# Patient Record
Sex: Male | Born: 1985 | Race: White | Hispanic: No | Marital: Single | Smoking: Current every day smoker
Health system: Southern US, Community
[De-identification: ages and names within clinical notes are randomized; demographics above are authoritative.]

---
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
## 2005-04-06 ENCOUNTER — Emergency Department (HOSPITAL_COMMUNITY): Admission: EM | Admit: 2005-04-06 | Discharge: 2005-04-06 | Payer: Self-pay | Admitting: Emergency Medicine

## 2005-09-17 ENCOUNTER — Emergency Department (HOSPITAL_COMMUNITY): Admission: EM | Admit: 2005-09-17 | Discharge: 2005-09-17 | Payer: Self-pay | Admitting: Emergency Medicine

## 2005-10-20 ENCOUNTER — Emergency Department (HOSPITAL_COMMUNITY): Admission: EM | Admit: 2005-10-20 | Discharge: 2005-10-20 | Payer: Self-pay | Admitting: Emergency Medicine

## 2005-10-28 ENCOUNTER — Emergency Department (HOSPITAL_COMMUNITY): Admission: EM | Admit: 2005-10-28 | Discharge: 2005-10-28 | Payer: Self-pay | Admitting: Emergency Medicine

## 2005-11-08 ENCOUNTER — Emergency Department (HOSPITAL_COMMUNITY): Admission: EM | Admit: 2005-11-08 | Discharge: 2005-11-08 | Payer: Self-pay | Admitting: Emergency Medicine

## 2005-12-08 ENCOUNTER — Emergency Department (HOSPITAL_COMMUNITY): Admission: EM | Admit: 2005-12-08 | Discharge: 2005-12-08 | Payer: Self-pay | Admitting: Emergency Medicine

## 2007-08-29 ENCOUNTER — Emergency Department (HOSPITAL_COMMUNITY): Admission: EM | Admit: 2007-08-29 | Discharge: 2007-08-29 | Payer: Self-pay | Admitting: Emergency Medicine

## 2007-09-05 ENCOUNTER — Emergency Department (HOSPITAL_COMMUNITY): Admission: EM | Admit: 2007-09-05 | Discharge: 2007-09-05 | Payer: Self-pay | Admitting: Emergency Medicine

## 2008-02-06 ENCOUNTER — Emergency Department (HOSPITAL_COMMUNITY): Admission: EM | Admit: 2008-02-06 | Discharge: 2008-02-06 | Payer: Self-pay | Admitting: Emergency Medicine

## 2008-02-10 ENCOUNTER — Emergency Department (HOSPITAL_COMMUNITY): Admission: EM | Admit: 2008-02-10 | Discharge: 2008-02-10 | Payer: Self-pay | Admitting: Emergency Medicine

## 2008-06-10 ENCOUNTER — Emergency Department (HOSPITAL_COMMUNITY): Admission: EM | Admit: 2008-06-10 | Discharge: 2008-06-10 | Payer: Self-pay | Admitting: Emergency Medicine

## 2009-03-19 ENCOUNTER — Emergency Department (HOSPITAL_COMMUNITY): Admission: EM | Admit: 2009-03-19 | Discharge: 2009-03-19 | Payer: Self-pay | Admitting: Emergency Medicine

## 2009-03-31 ENCOUNTER — Emergency Department (HOSPITAL_COMMUNITY): Admission: EM | Admit: 2009-03-31 | Discharge: 2009-03-31 | Payer: Self-pay | Admitting: Emergency Medicine

## 2009-05-24 ENCOUNTER — Emergency Department (HOSPITAL_COMMUNITY): Admission: EM | Admit: 2009-05-24 | Discharge: 2009-05-24 | Payer: Self-pay | Admitting: Emergency Medicine

## 2009-06-06 ENCOUNTER — Emergency Department (HOSPITAL_COMMUNITY): Admission: EM | Admit: 2009-06-06 | Discharge: 2009-06-06 | Payer: Self-pay | Admitting: Emergency Medicine

## 2010-04-06 LAB — URINALYSIS, ROUTINE W REFLEX MICROSCOPIC
Glucose, UA: NEGATIVE mg/dL
Hgb urine dipstick: NEGATIVE
Protein, ur: NEGATIVE mg/dL
pH: 6.5 (ref 5.0–8.0)

## 2010-04-06 LAB — RAPID URINE DRUG SCREEN, HOSP PERFORMED
Amphetamines: NOT DETECTED
Barbiturates: NOT DETECTED
Benzodiazepines: NOT DETECTED
Cocaine: POSITIVE — AB
Opiates: NOT DETECTED

## 2010-04-06 LAB — DIFFERENTIAL
Basophils Absolute: 0 10*3/uL (ref 0.0–0.1)
Eosinophils Relative: 3 % (ref 0–5)
Lymphocytes Relative: 24 % (ref 12–46)
Monocytes Absolute: 1.1 10*3/uL — ABNORMAL HIGH (ref 0.1–1.0)

## 2010-04-06 LAB — COMPREHENSIVE METABOLIC PANEL
AST: 24 U/L (ref 0–37)
Albumin: 4 g/dL (ref 3.5–5.2)
Alkaline Phosphatase: 83 U/L (ref 39–117)
Chloride: 107 mEq/L (ref 96–112)
GFR calc Af Amer: >60 mL/min (ref >60)
Potassium: 3.9 mEq/L (ref 3.5–5.1)
Total Bilirubin: 0.5 mg/dL (ref 0.3–1.2)

## 2010-04-06 LAB — POCT CARDIAC MARKERS: Myoglobin, poc: 63.3 ng/mL (ref 12–200)

## 2010-04-06 LAB — CBC
Platelets: 179 10*3/uL (ref 150–400)
WBC: 9.7 10*3/uL (ref 4.0–10.5)

## 2010-06-07 ENCOUNTER — Emergency Department (HOSPITAL_COMMUNITY)
Admission: EM | Admit: 2010-06-07 | Discharge: 2010-06-07 | Disposition: A | Discharge status: Home / Self Care | Payer: Self-pay | Attending: Emergency Medicine | Admitting: Emergency Medicine

## 2010-06-07 DIAGNOSIS — R0789 Other chest pain: Secondary | ICD-10-CM | POA: Insufficient documentation

## 2010-11-13 ENCOUNTER — Emergency Department (HOSPITAL_COMMUNITY)
Admission: EM | Admit: 2010-11-13 | Discharge: 2010-11-13 | Disposition: A | Discharge status: Home / Self Care | Payer: No Typology Code available for payment source | Attending: Emergency Medicine | Admitting: Emergency Medicine

## 2010-11-13 ENCOUNTER — Emergency Department (HOSPITAL_COMMUNITY): Payer: No Typology Code available for payment source

## 2010-11-13 DIAGNOSIS — S7000XA Contusion of unspecified hip, initial encounter: Secondary | ICD-10-CM | POA: Insufficient documentation

## 2010-11-13 DIAGNOSIS — IMO0002 Reserved for concepts with insufficient information to code with codable children: Secondary | ICD-10-CM | POA: Insufficient documentation

## 2010-11-13 DIAGNOSIS — R209 Unspecified disturbances of skin sensation: Secondary | ICD-10-CM | POA: Insufficient documentation

## 2010-11-13 DIAGNOSIS — F17200 Nicotine dependence, unspecified, uncomplicated: Secondary | ICD-10-CM | POA: Insufficient documentation

## 2010-11-13 DIAGNOSIS — M5416 Radiculopathy, lumbar region: Secondary | ICD-10-CM

## 2010-11-13 DIAGNOSIS — M25559 Pain in unspecified hip: Secondary | ICD-10-CM | POA: Insufficient documentation

## 2010-11-13 MED ORDER — HYDROCODONE-ACETAMINOPHEN 5-325 MG PO TABS: ORAL_TABLET | ORAL | Status: DC
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
## 2010-11-13 NOTE — ED Provider Notes (Signed)
Medical screening examination/treatment/procedure(s) were performed by non-physician practitioner and as supervising physician I was immediately available for consultation/collaboration.   Nimco Bivens M Lucella Pommier, DO 11/13/10 1743 
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
## 2011-12-19 ENCOUNTER — Encounter (HOSPITAL_COMMUNITY): Payer: Self-pay | Admitting: *Deleted

## 2011-12-19 ENCOUNTER — Emergency Department (HOSPITAL_COMMUNITY): Payer: Medicaid Other

## 2011-12-19 ENCOUNTER — Emergency Department (HOSPITAL_COMMUNITY)
Admission: EM | Admit: 2011-12-19 | Discharge: 2011-12-19 | Disposition: A | Discharge status: Home / Self Care | Payer: Medicaid Other | Attending: Emergency Medicine | Admitting: Emergency Medicine

## 2011-12-19 DIAGNOSIS — IMO0002 Reserved for concepts with insufficient information to code with codable children: Secondary | ICD-10-CM

## 2011-12-19 DIAGNOSIS — F17200 Nicotine dependence, unspecified, uncomplicated: Secondary | ICD-10-CM | POA: Insufficient documentation

## 2011-12-19 DIAGNOSIS — S21209A Unspecified open wound of unspecified back wall of thorax without penetration into thoracic cavity, initial encounter: Secondary | ICD-10-CM | POA: Insufficient documentation

## 2011-12-19 DIAGNOSIS — Z23 Encounter for immunization: Secondary | ICD-10-CM | POA: Insufficient documentation

## 2011-12-19 MED ORDER — TETANUS-DIPHTH-ACELL PERTUSSIS 5-2.5-18.5 LF-MCG/0.5 IM SUSP
0.5000 mL | Freq: Once | INTRAMUSCULAR | Status: AC
  Administered 2011-12-19: 0.5 mL via INTRAMUSCULAR
  Filled 2011-12-19: qty 0.5

## 2011-12-19 MED ORDER — LIDOCAINE-EPINEPHRINE 2 %-1:100000 IJ SOLN
30.0000 mL | Freq: Once | INTRAMUSCULAR | Status: AC
  Administered 2011-12-19: 30 mL
  Filled 2011-12-19: qty 30

## 2011-12-19 NOTE — ED Provider Notes (Signed)
Medical screening examination/treatment/procedure(s) were conducted as a shared visit with non-physician practitioner(s) and myself.  I personally evaluated the patient during the encounter   Rolan Bucco, MD 12/19/11 1520

## 2011-12-19 NOTE — ED Provider Notes (Signed)
LACERATION REPAIR Performed by: Wynetta Emery Authorized by: Wynetta Emery Consent: Verbal consent obtained. Risks and benefits: risks, benefits and alternatives were discussed Consent given by: patient Patient identity confirmed: provided demographic data Prepped and Draped in normal sterile fashion  Tetanus Updated  Laceration Location: Lumbar back  Laceration Length: 15 cm and 1cm  Anesthesia: Local   Local anesthetic: 2% lidocaine with epi   Anesthetic total: 10 ml  Irrigation method: syringe  Amount of cleaning: copious   Wound explored to depth in good light on a bloodless field with no foreign bodies seen or palpated.   Skin closure: Small staples   Patient tolerance: Patient tolerated the procedure well with no immediate complications.  Antibx ointment applied. Instructions for care discussed verbally and patient provided with additional written instructions for homecare and f/u.  Wynetta Emery, PA-C 12/19/11 1453
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
## 2011-12-22 ENCOUNTER — Emergency Department (HOSPITAL_COMMUNITY)
Admission: EM | Admit: 2011-12-22 | Discharge: 2011-12-22 | Disposition: A | Discharge status: Home / Self Care | Payer: Medicaid Other | Attending: Emergency Medicine | Admitting: Emergency Medicine

## 2011-12-22 ENCOUNTER — Encounter (HOSPITAL_COMMUNITY): Payer: Self-pay

## 2011-12-22 DIAGNOSIS — Y289XXA Contact with unspecified sharp object, undetermined intent, initial encounter: Secondary | ICD-10-CM | POA: Insufficient documentation

## 2011-12-22 DIAGNOSIS — Z4801 Encounter for change or removal of surgical wound dressing: Secondary | ICD-10-CM | POA: Insufficient documentation

## 2011-12-22 DIAGNOSIS — Y929 Unspecified place or not applicable: Secondary | ICD-10-CM | POA: Insufficient documentation

## 2011-12-22 DIAGNOSIS — S21209A Unspecified open wound of unspecified back wall of thorax without penetration into thoracic cavity, initial encounter: Secondary | ICD-10-CM | POA: Insufficient documentation

## 2011-12-22 DIAGNOSIS — IMO0002 Reserved for concepts with insufficient information to code with codable children: Secondary | ICD-10-CM

## 2011-12-22 DIAGNOSIS — Y939 Activity, unspecified: Secondary | ICD-10-CM | POA: Insufficient documentation

## 2011-12-22 DIAGNOSIS — F17200 Nicotine dependence, unspecified, uncomplicated: Secondary | ICD-10-CM | POA: Insufficient documentation

## 2011-12-22 MED ORDER — HYDROCODONE-ACETAMINOPHEN 5-325 MG PO TABS: 1.0000 | ORAL_TABLET | ORAL | Status: AC | PRN

## 2011-12-22 MED ORDER — HYDROCODONE-ACETAMINOPHEN 5-325 MG PO TABS
1.0000 | ORAL_TABLET | Freq: Once | ORAL | Status: AC
  Administered 2011-12-22: 1 via ORAL
  Filled 2011-12-22: qty 1
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
## 2011-12-25 ENCOUNTER — Emergency Department (HOSPITAL_COMMUNITY)
Admission: EM | Admit: 2011-12-25 | Discharge: 2011-12-25 | Disposition: A | Discharge status: Home / Self Care | Payer: Medicaid Other | Attending: Emergency Medicine | Admitting: Emergency Medicine

## 2011-12-25 ENCOUNTER — Encounter (HOSPITAL_COMMUNITY): Payer: Self-pay | Admitting: *Deleted

## 2011-12-25 DIAGNOSIS — F17200 Nicotine dependence, unspecified, uncomplicated: Secondary | ICD-10-CM | POA: Insufficient documentation

## 2011-12-25 DIAGNOSIS — Z4802 Encounter for removal of sutures: Secondary | ICD-10-CM | POA: Insufficient documentation

## 2011-12-25 NOTE — ED Provider Notes (Signed)
Medical screening examination/treatment/procedure(s) were performed by non-physician practitioner and as supervising physician I was immediately available for consultation/collaboration.   Dione Booze, MD 12/25/11 [phone]

## 2011-12-25 NOTE — ED Notes (Signed)
Pt here for staple removal to right side. Staples were placed last Saturday.
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
## 2011-12-25 NOTE — ED Notes (Signed)
Pt here for staple removal to lac to mid right back

## 2011-12-26 NOTE — ED Provider Notes (Signed)
Medical screening examination/treatment/procedure(s) were performed by non-physician practitioner and as supervising physician I was immediately available for consultation/collaboration.   Toniya Rozar W Graiden Henes, MD 12/26/11 1733 

## 2012-08-22 ENCOUNTER — Emergency Department (HOSPITAL_COMMUNITY)
Admission: EM | Admit: 2012-08-22 | Discharge: 2012-08-22 | Disposition: A | Discharge status: Home / Self Care | Payer: Self-pay | Attending: Emergency Medicine | Admitting: Emergency Medicine

## 2012-08-22 ENCOUNTER — Encounter (HOSPITAL_COMMUNITY): Payer: Self-pay

## 2012-08-22 DIAGNOSIS — K0889 Other specified disorders of teeth and supporting structures: Secondary | ICD-10-CM

## 2012-08-22 DIAGNOSIS — Z880 Allergy status to penicillin: Secondary | ICD-10-CM | POA: Insufficient documentation

## 2012-08-22 DIAGNOSIS — F17200 Nicotine dependence, unspecified, uncomplicated: Secondary | ICD-10-CM | POA: Insufficient documentation

## 2012-08-22 DIAGNOSIS — K089 Disorder of teeth and supporting structures, unspecified: Secondary | ICD-10-CM | POA: Insufficient documentation

## 2012-08-22 MED ORDER — HYDROCODONE-ACETAMINOPHEN 5-325 MG PO TABS: 2.0000 | ORAL_TABLET | ORAL | Status: DC | PRN

## 2012-08-22 MED ORDER — CLINDAMYCIN HCL 300 MG PO CAPS: 300.0000 mg | ORAL_CAPSULE | Freq: Four times a day (QID) | ORAL | Status: DC

## 2012-08-22 NOTE — ED Provider Notes (Signed)
  CSN: [id number]     Arrival date & time 08/22/12  1405 History     First MD Initiated Contact with Patient 08/22/12 1433     Chief Complaint  Patient presents with  . Dental Pain   (Consider location/radiation/quality/duration/timing/severity/associated sxs/prior Treatment) Patient is a 27 y.o. male presenting with tooth pain. The history is provided by the patient. No language interpreter was used.  Dental Pain Location:  Lower Lower teeth location:  30/RL 1st molar Quality:  Aching Severity:  Moderate Onset quality:  Sudden Timing:  Constant Progression:  Worsening Chronicity:  New Worsened by:  Nothing tried Ineffective treatments:  None tried Pt complains of pain in his right lower tooth.   History reviewed. No pertinent past medical history. History reviewed. No pertinent past surgical history. No family history on file. History  Substance Use Topics  . Smoking status: Current Every Day Smoker -- 1.00 packs/day    Types: Cigarettes  . Smokeless tobacco: Not on file  . Alcohol Use: Yes     Comment: occ    Review of Systems  HENT: Positive for dental problem.   All other systems reviewed and are negative.    Allergies  Penicillins and Tramadol  Home Medications   Current Outpatient Rx  Name  Route  Sig  Dispense  Refill  . acetaminophen (TYLENOL) 500 MG tablet   Oral   Take 1,000 mg by mouth daily as needed. For pain         . ibuprofen (ADVIL,MOTRIN) 200 MG tablet   Oral   Take 400 mg by mouth every 8 (eight) hours as needed for pain. For pain          BP 125/88  Pulse 117  Temp(Src) 97.6 F (36.4 C) (Oral)  Resp 18  Ht 6' (1.829 m)  Wt 160 lb (72.576 kg)  BMI 21.7 kg/m2  SpO2 100% Physical Exam  Nursing note and vitals reviewed. Constitutional: He appears well-developed and well-nourished.  HENT:  Head: Normocephalic and atraumatic.  Right Ear: External ear normal.  Left Ear: External ear normal.  Mouth/Throat: Oropharynx is clear and  moist.  Broken tooth, large cavity  Eyes: Conjunctivae are normal. Pupils are equal, round, and reactive to light.  Neck: Normal range of motion.  Cardiovascular: Normal rate.   Pulmonary/Chest: Effort normal.    ED Course   Procedures (including critical care time)  Labs Reviewed - No data to display No results found. 1. Toothache     MDM  Schedule to see the dentist for treatment  Elson Areas, PA-C 08/22/12 1444

## 2012-08-22 NOTE — ED Provider Notes (Signed)
Medical screening examination/treatment/procedure(s) were performed by non-physician practitioner and as supervising physician I was immediately available for consultation/collaboration.    Hyatt Capobianco J. Kellon Chalk, MD 08/22/12 1515 

## 2012-08-22 NOTE — ED Notes (Signed)
Pt c/o right lower dental pain x2 weeks. Pt denies drainage or bleeding from area.

## 2012-08-22 NOTE — ED Notes (Signed)
Pt reports right lower dental pain for 2 weeks.

## 2012-12-09 ENCOUNTER — Encounter (HOSPITAL_COMMUNITY): Payer: Self-pay | Admitting: Emergency Medicine

## 2012-12-09 ENCOUNTER — Emergency Department (HOSPITAL_COMMUNITY)
Admission: EM | Admit: 2012-12-09 | Discharge: 2012-12-09 | Disposition: A | Discharge status: Home / Self Care | Payer: Medicaid Other | Attending: Emergency Medicine | Admitting: Emergency Medicine

## 2012-12-09 DIAGNOSIS — Z792 Long term (current) use of antibiotics: Secondary | ICD-10-CM | POA: Insufficient documentation

## 2012-12-09 DIAGNOSIS — K029 Dental caries, unspecified: Secondary | ICD-10-CM

## 2012-12-09 DIAGNOSIS — Z880 Allergy status to penicillin: Secondary | ICD-10-CM | POA: Insufficient documentation

## 2012-12-09 DIAGNOSIS — F17200 Nicotine dependence, unspecified, uncomplicated: Secondary | ICD-10-CM | POA: Insufficient documentation

## 2012-12-09 DIAGNOSIS — K0381 Cracked tooth: Secondary | ICD-10-CM | POA: Insufficient documentation

## 2012-12-09 MED ORDER — HYDROCODONE-ACETAMINOPHEN 5-325 MG PO TABS
1.0000 | ORAL_TABLET | Freq: Once | ORAL | Status: AC
  Administered 2012-12-09: 1 via ORAL
  Filled 2012-12-09: qty 1

## 2012-12-09 MED ORDER — METRONIDAZOLE IN NACL 5-0.79 MG/ML-% IV SOLN: 500.0000 mg | Freq: Once | INTRAVENOUS | Status: DC

## 2012-12-09 MED ORDER — NAPROXEN 500 MG PO TABS: 500.0000 mg | ORAL_TABLET | Freq: Two times a day (BID) | ORAL | Status: DC

## 2012-12-09 MED ORDER — METRONIDAZOLE 500 MG PO TABS: 500.0000 mg | ORAL_TABLET | Freq: Two times a day (BID) | ORAL | Status: DC

## 2012-12-09 MED ORDER — METRONIDAZOLE 500 MG PO TABS
500.0000 mg | ORAL_TABLET | Freq: Once | ORAL | Status: AC
  Administered 2012-12-09: 500 mg via ORAL

## 2012-12-09 MED ORDER — METRONIDAZOLE 500 MG PO TABS
ORAL_TABLET | ORAL | Status: AC
  Filled 2012-12-09: qty 1

## 2012-12-09 NOTE — ED Provider Notes (Signed)
CSN: [id number]     Arrival date & time 12/09/12  1610 History   First MD Initiated Contact with Patient 12/09/12 1619     Chief Complaint  Patient presents with  . Dental Pain   (Consider location/radiation/quality/duration/timing/severity/associated sxs/prior Treatment) Patient is a 27 y.o. male presenting with tooth pain. The history is provided by the patient.  Dental Pain Location:  Lower Lower teeth location:  31/RL 2nd molar Quality:  Throbbing and constant Onset quality:  Gradual Duration:  1 day Timing:  Constant Progression:  Unchanged Chronicity:  New Context comment:  Chipped a tooth Relieved by:  None tried Worsened by:  Pressure Associated symptoms: no difficulty swallowing, no facial pain, no gum swelling, no neck swelling, no oral bleeding and no trismus   Risk factors: lack of dental care and smoking    Ryan Maldonado is a 27 y.o. male who presents to the ED with dental pain. Last night a tooth on the lower right side broke and he started having pain. The tooth is decayed.   History reviewed. No pertinent past medical history. History reviewed. No pertinent past surgical history. History reviewed. No pertinent family history. History  Substance Use Topics  . Smoking status: Current Every Day Smoker -- 1.00 packs/day    Types: Cigarettes  . Smokeless tobacco: Not on file  . Alcohol Use: No     Comment: denies use 12/09/12    Review of Systems Negative except as stated in HPI Allergies  Penicillins and Tramadol  Home Medications   Current Outpatient Rx  Name  Route  Sig  Dispense  Refill  . acetaminophen (TYLENOL) 500 MG tablet   Oral   Take 1,000 mg by mouth daily as needed. For pain         . clindamycin (CLEOCIN) 300 MG capsule   Oral   Take 1 capsule (300 mg total) by mouth 4 (four) times daily.   28 capsule   0   . HYDROcodone-acetaminophen (NORCO/VICODIN) 5-325 MG per tablet   Oral   Take 2 tablets by mouth every 4 (four) hours as  needed for pain.   20 tablet   0   . ibuprofen (ADVIL,MOTRIN) 200 MG tablet   Oral   Take 400 mg by mouth every 8 (eight) hours as needed for pain. For pain          BP 138/69  Pulse 72  Temp(Src) 98 F (36.7 C) (Oral)  Resp 20  Ht 6' (1.829 m)  Wt 150 lb (68.04 kg)  BMI 20.34 kg/m2  SpO2 100% Physical Exam  Nursing note and vitals reviewed. Constitutional: He is oriented to person, place, and time. He appears well-developed and well-nourished. No distress.  HENT:  Head: Normocephalic and atraumatic.  Mouth/Throat: Uvula is midline, oropharynx is clear and moist and mucous membranes are normal. Dental caries present.    Right lower second molar broken and decayed into the gum line. Tender on exam.  Eyes: EOM are normal.  Neck: Neck supple.  Cardiovascular: Normal rate.   Pulmonary/Chest: Effort normal.  Musculoskeletal: Normal range of motion.  Lymphadenopathy:    He has no cervical adenopathy.  Neurological: He is alert and oriented to person, place, and time. No cranial nerve deficit.  Skin: Skin is warm and dry.  Psychiatric: He has a normal mood and affect. His behavior is normal.    ED Course  Procedures   EKG Interpretation   None       MDM  27 y.o. male with dental pain due to carries. Will treat with antibiotics and NSAIDS. He will follow up with the dental clinic. Stable for discharge without any immediate complications.  Discussed with the patient and all questioned fully answered.   Medication List    TAKE these medications       metroNIDAZOLE 500 MG tablet  Commonly known as:  FLAGYL  Take 1 tablet (500 mg total) by mouth 2 (two) times daily.     naproxen 500 MG tablet  Commonly known as:  NAPROSYN  Take 1 tablet (500 mg total) by mouth 2 (two) times daily with a meal.      ASK your doctor about these medications       acetaminophen 500 MG tablet  Commonly known as:  TYLENOL  Take 1,000 mg by mouth daily as needed. For pain      clindamycin 300 MG capsule  Commonly known as:  CLEOCIN  Take 1 capsule (300 mg total) by mouth 4 (four) times daily.     HYDROcodone-acetaminophen 5-325 MG per tablet  Commonly known as:  NORCO/VICODIN  Take 2 tablets by mouth every 4 (four) hours as needed for pain.     ibuprofen 200 MG tablet  Commonly known as:  ADVIL,MOTRIN  Take 400 mg by mouth every 8 (eight) hours as needed for pain. For pain            Janne Napoleon, [place] 12/09/12 [phone]

## 2012-12-09 NOTE — ED Notes (Signed)
Pt states tooth chipped yesterday and with dental pain, denies having a dentist to see

## 2012-12-10 NOTE — ED Provider Notes (Signed)
Medical screening examination/treatment/procedure(s) were performed by non-physician practitioner and as supervising physician I was immediately available for consultation/collaboration.  EKG Interpretation   None       Bonnetta Allbee, MD, FACEP   Kruze Atchley L Leda Bellefeuille, MD 12/10/12 0037 

## 2013-02-18 ENCOUNTER — Encounter (HOSPITAL_COMMUNITY): Payer: Self-pay | Admitting: Emergency Medicine

## 2013-02-18 ENCOUNTER — Emergency Department (HOSPITAL_COMMUNITY)
Admission: EM | Admit: 2013-02-18 | Discharge: 2013-02-18 | Disposition: A | Discharge status: Home / Self Care | Payer: Medicaid Other | Attending: Emergency Medicine | Admitting: Emergency Medicine

## 2013-02-18 DIAGNOSIS — F17200 Nicotine dependence, unspecified, uncomplicated: Secondary | ICD-10-CM | POA: Insufficient documentation

## 2013-02-18 DIAGNOSIS — K0889 Other specified disorders of teeth and supporting structures: Secondary | ICD-10-CM

## 2013-02-18 DIAGNOSIS — G8929 Other chronic pain: Secondary | ICD-10-CM | POA: Insufficient documentation

## 2013-02-18 DIAGNOSIS — K089 Disorder of teeth and supporting structures, unspecified: Secondary | ICD-10-CM | POA: Insufficient documentation

## 2013-02-18 DIAGNOSIS — Z880 Allergy status to penicillin: Secondary | ICD-10-CM | POA: Insufficient documentation

## 2013-02-18 MED ORDER — CLINDAMYCIN HCL 150 MG PO CAPS: 150.0000 mg | ORAL_CAPSULE | Freq: Four times a day (QID) | ORAL | Status: DC

## 2013-02-18 MED ORDER — IBUPROFEN 800 MG PO TABS
800.0000 mg | ORAL_TABLET | Freq: Once | ORAL | Status: AC
  Administered 2013-02-18: 800 mg via ORAL
  Filled 2013-02-18: qty 1

## 2013-02-18 NOTE — ED Notes (Signed)
Awoke with pain right lower molar area.  Has taken Goody powder without relief

## 2013-02-18 NOTE — Discharge Instructions (Signed)
Dental Pain °Toothache is pain in or around a tooth. It may get worse with chewing or with cold or heat.  °HOME CARE °· Your dentist may use a numbing medicine during treatment. If so, you may need to avoid eating until the medicine wears off. Ask your dentist about this. °· Only take medicine as told by your dentist or doctor. °· Avoid chewing food near the painful tooth until after all treatment is done. Ask your dentist about this. °GET HELP RIGHT AWAY IF:  °· The problem gets worse or new problems appear. °· You have a fever. °· There is redness and puffiness (swelling) of the face, jaw, or neck. °· You cannot open your mouth. °· There is pain in the jaw. °· There is very bad pain that is not helped by medicine. °MAKE SURE YOU:  °· Understand these instructions. °· Will watch your condition. °· Will get help right away if you are not doing well or get worse. °Document Released: 06/23/2007 Document Revised: 03/29/2011 Document Reviewed: 06/23/2007 °ExitCare® Patient Information ©2014 [employer]. ° °

## 2013-02-18 NOTE — ED Notes (Signed)
MD at bedside. 

## 2013-02-18 NOTE — ED Provider Notes (Signed)
CSN: [id number]     Arrival date & time 02/18/13  0424 History   First MD Initiated Contact with Patient 02/18/13 [phone]     Chief Complaint  Patient presents with  . Dental Pain    Patient is a 28 y.o. male presenting with tooth pain. The history is provided by the patient.  Dental Pain Location:  Lower Severity:  Moderate Onset quality:  Gradual Duration: several months ago. Timing:  Constant Progression:  Worsening Chronicity:  Chronic Relieved by:  Nothing   PMH -none History reviewed. No pertinent past surgical history. No family history on file. History  Substance Use Topics  . Smoking status: Current Every Day Smoker -- 1.00 packs/day    Types: Cigarettes  . Smokeless tobacco: Not on file  . Alcohol Use: No     Comment: denies use 12/09/12    Review of Systems  HENT: Positive for dental problem.   Gastrointestinal: Negative for vomiting.    Allergies  Penicillins and Tramadol  Home Medications   Current Outpatient Rx  Name  Route  Sig  Dispense  Refill  . acetaminophen (TYLENOL) 500 MG tablet   Oral   Take 1,000 mg by mouth daily as needed. For pain         . clindamycin (CLEOCIN) 150 MG capsule   Oral   Take 1 capsule (150 mg total) by mouth every 6 (six) hours.   28 capsule   0    BP 145/80  Pulse 58  Temp(Src) 97.7 F (36.5 C) (Oral)  Resp 18  Ht 5\' 11"  (1.803 m)  Wt 155 lb (70.308 kg)  BMI 21.63 kg/m2  SpO2 98% Physical Exam CONSTITUTIONAL: Well developed/well nourished HEAD AND FACE: Normocephalic/atraumatic EYES: EOMI/PERRL ENMT: Mucous membranes moist.  Poor dentition.  No trismus.  No focal abscess noted. Decayed right lower molar noted NECK: supple no meningeal signs CV: S1/S2 noted, no murmurs/rubs/gallops noted LUNGS: Lungs are clear to auscultation bilaterally, no apparent distress ABDOMEN: soft NEURO: Pt is awake/alert, moves all extremitiesx4 EXTREMITIES:full ROM SKIN: warm, color normal  ED Course  Procedures (including  critical care time) Labs Review Labs Reviewed - No data to display Imaging Review No results found.  EKG Interpretation   None       MDM   1. Pain, dental    Nursing notes including past medical history and social history reviewed and considered in documentation     Joya Gaskinsonald W Nayleah Gamel, MD 02/18/13 [phone]

## 2013-04-06 ENCOUNTER — Encounter (HOSPITAL_COMMUNITY): Payer: Self-pay | Admitting: Emergency Medicine

## 2013-04-06 ENCOUNTER — Emergency Department (HOSPITAL_COMMUNITY): Payer: Medicaid Other

## 2013-04-06 ENCOUNTER — Emergency Department (HOSPITAL_COMMUNITY)
Admission: EM | Admit: 2013-04-06 | Discharge: 2013-04-06 | Disposition: A | Discharge status: Home / Self Care | Payer: Medicaid Other | Attending: Emergency Medicine | Admitting: Emergency Medicine

## 2013-04-06 DIAGNOSIS — F17200 Nicotine dependence, unspecified, uncomplicated: Secondary | ICD-10-CM | POA: Insufficient documentation

## 2013-04-06 DIAGNOSIS — X58XXXA Exposure to other specified factors, initial encounter: Secondary | ICD-10-CM | POA: Insufficient documentation

## 2013-04-06 DIAGNOSIS — Y9389 Activity, other specified: Secondary | ICD-10-CM | POA: Insufficient documentation

## 2013-04-06 DIAGNOSIS — S9030XA Contusion of unspecified foot, initial encounter: Secondary | ICD-10-CM | POA: Insufficient documentation

## 2013-04-06 DIAGNOSIS — Z880 Allergy status to penicillin: Secondary | ICD-10-CM | POA: Insufficient documentation

## 2013-04-06 DIAGNOSIS — S9032XA Contusion of left foot, initial encounter: Secondary | ICD-10-CM

## 2013-04-06 DIAGNOSIS — Y929 Unspecified place or not applicable: Secondary | ICD-10-CM | POA: Insufficient documentation

## 2013-04-06 MED ORDER — IBUPROFEN 800 MG PO TABS
800.0000 mg | ORAL_TABLET | Freq: Once | ORAL | Status: AC
  Administered 2013-04-06: 800 mg via ORAL
  Filled 2013-04-06: qty 1

## 2013-04-06 MED ORDER — IBUPROFEN 800 MG PO TABS: 800.0000 mg | ORAL_TABLET | Freq: Three times a day (TID) | ORAL | Status: DC | PRN

## 2013-04-06 NOTE — Discharge Instructions (Signed)
Foot Contusion °A foot contusion is a deep bruise to the foot. Contusions are the result of an injury that caused bleeding under the skin. The contusion may turn blue, purple, or yellow. Minor injuries will give you a painless contusion, but more severe contusions may stay painful and swollen for a few weeks. °CAUSES  °A foot contusion comes from a direct blow to that area, such as a heavy object falling on the foot. °SYMPTOMS  °· Swelling of the foot. °· Discoloration of the foot. °· Tenderness or soreness of the foot. °DIAGNOSIS  °You will have a physical exam and will be asked about your history. You may need an X-ray of your foot to look for a broken bone (fracture).  °TREATMENT  °An elastic wrap may be recommended to support your foot. Resting, elevating, and applying cold compresses to your foot are often the best treatments for a foot contusion. Over-the-counter medicines may also be recommended for pain control. °HOME CARE INSTRUCTIONS  °· Put ice on the injured area. °· Put ice in a plastic bag. °· Place a towel between your skin and the bag. °· Leave the ice on for 15-20 minutes, 03-04 times a day. °· Only take over-the-counter or prescription medicines for pain, discomfort, or fever as directed by your caregiver. °· If told, use an elastic wrap as directed. This can help reduce swelling. You may remove the wrap for sleeping, showering, and bathing. If your toes become numb, cold, or blue, take the wrap off and reapply it more loosely. °· Elevate your foot with pillows to reduce swelling. °· Try to avoid standing or walking while the foot is painful. Do not resume use until instructed by your caregiver. Then, begin use gradually. If pain develops, decrease use. Gradually increase activities that do not cause discomfort until you have normal use of your foot. °· See your caregiver as directed. It is very important to keep all follow-up appointments in order to avoid any lasting problems with your foot,  including long-term (chronic) pain. °SEEK IMMEDIATE MEDICAL CARE IF:  °· You have increased redness, swelling, or pain in your foot. °· Your swelling or pain is not relieved with medicines. °· You have loss of feeling in your foot or are unable to move your toes. °· Your foot turns cold or blue. °· You have pain when you move your toes. °· Your foot becomes warm to the touch. °· Your contusion does not improve in 2 days. °MAKE SURE YOU:  °· Understand these instructions. °· Will watch your condition. °· Will get help right away if you are not doing well or get worse. °Document Released: 10/26/2005 Document Revised: 07/06/2011 Document Reviewed: 12/08/2010 °ExitCare® Patient Information ©2014 [employer]. ° °

## 2013-04-06 NOTE — ED Notes (Signed)
Pain plantar surface of lt foot, Pt was standing on sofa, putting up blinds, slipped off and stepped on something on floor. Contusion present with 2 small red dots. Skin does not appear to be punctured. Painful to walk

## 2013-04-06 NOTE — Care Management Note (Signed)
ED/CM noted patient did not have health insurance and/or PCP listed in the computer.  Patient was given the [place] resource handout with information on the clinics, food pantries, and the handout for new health insurance sign-up.  Patient expressed appreciation for information received. 

## 2013-04-06 NOTE — ED Notes (Signed)
Pt states he stepped off his couch this morning and injured his left foot

## 2013-04-09 NOTE — ED Provider Notes (Signed)
CSN: [id number]     Arrival date & time 04/06/13  1203 History   First MD Initiated Contact with Patient 04/06/13 1237     Chief Complaint  Patient presents with  . Foot Pain     (Consider location/radiation/quality/duration/timing/severity/associated sxs/prior Treatment) HPI Comments: Ryan Maldonado is a 28 y.o. Male presenting with left heel and foot pain after he stepped off his sofa and directly onto a small hard object on the floor this morning.  He has worsened pain with weight bearing and has noticed increased swelling and bruising since the event.  He denies numbness distal to the injury site and did not injure his ankle during the event.  He has had no treatment prior to arrival.     The history is provided by the patient.    History reviewed. No pertinent past medical history. History reviewed. No pertinent past surgical history. No family history on file. History  Substance Use Topics  . Smoking status: Current Every Day Smoker -- 1.00 packs/day    Types: Cigarettes  . Smokeless tobacco: Not on file  . Alcohol Use: No     Comment: denies use 12/09/12    Review of Systems  Constitutional: Negative for fever.  Musculoskeletal: Positive for arthralgias and joint swelling. Negative for myalgias.  Skin: Positive for color change.  Neurological: Negative for weakness and numbness.      Allergies  Penicillins and Tramadol  Home Medications   Current Outpatient Rx  Name  Route  Sig  Dispense  Refill  . ibuprofen (ADVIL,MOTRIN) 800 MG tablet   Oral   Take 1 tablet (800 mg total) by mouth every 8 (eight) hours as needed.   30 tablet   0    BP 116/65  Pulse 68  Temp(Src) 98.8 F (37.1 C)  Resp 18  Ht 5\' 11"  (1.803 m)  Wt 150 lb (68.04 kg)  BMI 20.93 kg/m2  SpO2 100% Physical Exam  Constitutional: He appears well-developed and well-nourished.  HENT:  Head: Atraumatic.  Neck: Normal range of motion.  Cardiovascular:  Pulses equal bilaterally    Musculoskeletal: He exhibits tenderness.       Left foot: He exhibits tenderness and swelling. He exhibits normal range of motion, normal capillary refill and no deformity.       Feet:  ttp left medial instep of left foot.  Modes soft edema, early ecchymosis noted. Increased pain with dorsiflexion of toes. Pedal pulses intact,  Distal sensation normal with less than 3 sec cap refill.  Achilles tendon intact, ankle nontender.  Neurological: He is alert. He has normal strength. He displays normal reflexes. No sensory deficit.  Skin: Skin is warm and dry.  Psychiatric: He has a normal mood and affect.    ED Course  Procedures (including critical care time) Labs Review Labs Reviewed - No data to display Imaging Review No results found.   EKG Interpretation None      MDM   Final diagnoses:  Contusion of left heel    Patients labs and/or radiological studies were viewed and considered during the medical decision making and disposition process. Pt was placed in watson jones dressing, post op shoe.  Iburofen, encouraged RICE.  Recheck if not improving over the next week.  Referrals given.    Burgess AmorJulie Celesta Funderburk, PA-C 04/09/13 [phone]

## 2013-04-11 NOTE — ED Provider Notes (Signed)
Medical screening examination/treatment/procedure(s) were performed by non-physician practitioner and as supervising physician I was immediately available for consultation/collaboration.   EKG Interpretation None        Jozee Hammer J. Margaretha Mahan, MD 04/11/13 0704 

## 2013-06-17 ENCOUNTER — Emergency Department (HOSPITAL_COMMUNITY): Payer: Medicaid Other

## 2013-06-17 ENCOUNTER — Encounter (HOSPITAL_COMMUNITY): Payer: Self-pay | Admitting: Emergency Medicine

## 2013-06-17 ENCOUNTER — Emergency Department (HOSPITAL_COMMUNITY)
Admission: EM | Admit: 2013-06-17 | Discharge: 2013-06-17 | Disposition: A | Discharge status: Home / Self Care | Payer: Medicaid Other | Attending: Emergency Medicine | Admitting: Emergency Medicine

## 2013-06-17 DIAGNOSIS — Z880 Allergy status to penicillin: Secondary | ICD-10-CM | POA: Insufficient documentation

## 2013-06-17 DIAGNOSIS — M25569 Pain in unspecified knee: Secondary | ICD-10-CM | POA: Insufficient documentation

## 2013-06-17 DIAGNOSIS — M25561 Pain in right knee: Secondary | ICD-10-CM

## 2013-06-17 DIAGNOSIS — M13161 Monoarthritis, not elsewhere classified, right knee: Secondary | ICD-10-CM

## 2013-06-17 DIAGNOSIS — F17200 Nicotine dependence, unspecified, uncomplicated: Secondary | ICD-10-CM | POA: Insufficient documentation

## 2013-06-17 MED ORDER — HYDROCODONE-ACETAMINOPHEN 5-325 MG PO TABS
1.0000 | ORAL_TABLET | Freq: Once | ORAL | Status: AC
  Administered 2013-06-17: 1 via ORAL
  Filled 2013-06-17: qty 1

## 2013-06-17 MED ORDER — HYDROCODONE-ACETAMINOPHEN 5-325 MG PO TABS: 1.0000 | ORAL_TABLET | ORAL | Status: DC | PRN

## 2013-06-17 MED ORDER — SULFAMETHOXAZOLE-TMP DS 800-160 MG PO TABS
1.0000 | ORAL_TABLET | Freq: Once | ORAL | Status: AC
  Administered 2013-06-17: 1 via ORAL
  Filled 2013-06-17: qty 1

## 2013-06-17 MED ORDER — SULFAMETHOXAZOLE-TRIMETHOPRIM 800-160 MG PO TABS: 1.0000 | ORAL_TABLET | Freq: Two times a day (BID) | ORAL | Status: AC

## 2013-06-17 MED ORDER — MELOXICAM 7.5 MG PO TABS: 7.5000 mg | ORAL_TABLET | Freq: Every day | ORAL | Status: DC

## 2013-06-17 NOTE — ED Notes (Signed)
Pt c/o right knee pain that started a week ago, denies any injury, cms intact distal ,

## 2013-06-17 NOTE — ED Provider Notes (Signed)
CSN: [id number]     Arrival date & time 06/17/13  1356 History  This chart was scribed for non-physician practitioner, Kerrie Buffalo, FNP,working with Donnetta Hutching, MD, by Karle Plumber, ED Scribe.  This patient was seen in room APFT23/APFT23 and the patient's care was started at 3:01 PM.  Chief Complaint  Patient presents with  . Knee Pain   Patient is a 28 y.o. male presenting with knee pain. The history is provided by the patient. No language interpreter was used.  Knee Pain Location:  Knee Time since incident:  3 days Injury: no   Knee location:  R knee Pain details:    Quality:  Burning and aching   Radiates to:  Does not radiate   Severity:  Moderate   Onset quality:  Gradual   Duration:  3 days   Timing:  Constant   Progression:  Worsening Chronicity:  New Dislocation: no   Tetanus status:  Up to date Prior injury to area:  No Worsened by:  Activity and bearing weight Associated symptoms: swelling    HPI Comments:  Ryan Maldonado is a 28 y.o. male who presents to the Emergency Department complaining of moderate right knee pain that started approximately 3 days ago. Pt reports doing some work underneath his house. He reports associated redness of the knee. He states he has had pain in the same knee a few months ago. He states he did not seek treatment for that pain then. He states walking, bending the knee, or any movement to the knee makes the pain worse. Resting makes the pain better. He denies taking anything for pain. He denies any recent injury, trauma, or fall. He denies any insect or animal bite to the area. He was working under a house prior to having the pain so he could have had something bite him.  He denies nausea, vomiting, chills, fever, or any other complaints. Pt is ambulatory without issue. He states his last tetanus vaccination was about two years ago.  History reviewed. No pertinent past medical history. History reviewed. No pertinent past surgical history. No  family history on file. History  Substance Use Topics  . Smoking status: Current Every Day Smoker -- 1.00 packs/day    Types: Cigarettes  . Smokeless tobacco: Not on file  . Alcohol Use: No     Comment: denies use 12/09/12    Review of Systems  Musculoskeletal: Positive for arthralgias (right knee).  All other systems reviewed and are negative.   Allergies  Penicillins and Tramadol  Home Medications   Prior to Admission medications   Medication Sig Start Date End Date Taking? Authorizing Provider  ibuprofen (ADVIL,MOTRIN) 800 MG tablet Take 1 tablet (800 mg total) by mouth every 8 (eight) hours as needed. 04/06/13   Burgess Amor, PA-C   Triage Vitals: BP 127/77  Pulse 79  Temp(Src) 98.1 F (36.7 C) (Oral)  Resp 16  Ht 5\' 11"  (1.803 m)  Wt 150 lb (68.04 kg)  BMI 20.93 kg/m2  SpO2 100% Physical Exam  Nursing note and vitals reviewed. Constitutional: He is oriented to person, place, and time. He appears well-developed and well-nourished.  Eyes: EOM are normal.  Neck: Neck supple.  Cardiovascular:  Good pedal pulses.  Pulmonary/Chest: Effort normal.  Abdominal: Soft. There is no tenderness.  Musculoskeletal: Normal range of motion.  Right knee with minimal swelling on anterior aspect. Tender with palpation over the patella. Pain increases with flexion. Full ROM of knee.  Neurological: He is alert  and oriented to person, place, and time. No cranial nerve deficit.  Good touch sensation.  Skin: Skin is warm and dry.  One area of right knee that is approximately 0.5 cm that is raised with pustular center with redness extending below that for about 3 cm. Area is warm to touch.     ED Course  Procedures (including critical care time) DIAGNOSTIC STUDIES: Oxygen Saturation is 100% on RA, normal by my interpretation.   COORDINATION OF CARE: 3:06 PM- Will wrap with ACE bandage. Will prescribe pain medication and antibiotics. Advised pt to ice the area over the ACE bandage. Will  X-Ray right knee. Pt verbalizes understanding and agrees to plan.  Medications - No data to display  Labs Review Labs Reviewed - No data to display  Imaging Review Dg Knee Complete 4 Views Right  06/17/2013   CLINICAL DATA:  Right knee pain  EXAM: RIGHT KNEE - COMPLETE 4+ VIEW  COMPARISON:  None.  FINDINGS: There is no evidence of fracture, dislocation, or joint effusion. There is no evidence of arthropathy or other focal bone abnormality. Soft tissues are unremarkable.  IMPRESSION: Negative.   Electronically Signed   By: Elige KoHetal  Patel   On: 06/17/2013 15:32    MDM  28 y.o. male with pain, swelling and mild erythema of the right knee. I have reviewed this patient's vital signs, nurses notes, appropriate labs and imaging.  I have discussed findings and plan of care with the patient and he agrees with plan. He will follow up with ortho if symptoms worsen. Stable for discharge without fever, he remains neurovascularly intact.    Medication List         HYDROcodone-acetaminophen 5-325 MG per tablet  Commonly known as:  NORCO/VICODIN  Take 1 tablet by mouth every 4 (four) hours as needed.     meloxicam 7.5 MG tablet  Commonly known as:  MOBIC  Take 1 tablet (7.5 mg total) by mouth daily.     sulfamethoxazole-trimethoprim 800-160 MG per tablet  Commonly known as:  BACTRIM DS,SEPTRA DS  Take 1 tablet by mouth 2 (two) times daily.        I personally performed the services described in this documentation, which was scribed in my presence. The recorded information has been reviewed and is accurate.    [facility] Orlene OchM Ne[place], [place] 06/17/13 [phone]

## 2013-06-17 NOTE — Discharge Instructions (Signed)
Apply ice to the area, elevate and follow up with Dr. Romeo Apple if symptoms persist. Return here as needed.

## 2013-06-25 NOTE — ED Provider Notes (Signed)
Medical screening examination/treatment/procedure(s) were performed by non-physician practitioner and as supervising physician I was immediately available for consultation/collaboration.   EKG Interpretation None       Derrich Gaby, MD 06/25/13 1851 

## 2013-09-30 ENCOUNTER — Encounter (HOSPITAL_COMMUNITY): Payer: Self-pay | Admitting: Emergency Medicine

## 2013-09-30 ENCOUNTER — Emergency Department (HOSPITAL_COMMUNITY)
Admission: EM | Admit: 2013-09-30 | Discharge: 2013-09-30 | Disposition: A | Discharge status: Home / Self Care | Payer: Medicaid Other | Attending: Emergency Medicine | Admitting: Emergency Medicine

## 2013-09-30 DIAGNOSIS — Z791 Long term (current) use of non-steroidal anti-inflammatories (NSAID): Secondary | ICD-10-CM | POA: Insufficient documentation

## 2013-09-30 DIAGNOSIS — K047 Periapical abscess without sinus: Secondary | ICD-10-CM | POA: Diagnosis not present

## 2013-09-30 DIAGNOSIS — Z880 Allergy status to penicillin: Secondary | ICD-10-CM | POA: Diagnosis not present

## 2013-09-30 DIAGNOSIS — F17200 Nicotine dependence, unspecified, uncomplicated: Secondary | ICD-10-CM | POA: Insufficient documentation

## 2013-09-30 DIAGNOSIS — K089 Disorder of teeth and supporting structures, unspecified: Secondary | ICD-10-CM | POA: Insufficient documentation

## 2013-09-30 DIAGNOSIS — K029 Dental caries, unspecified: Secondary | ICD-10-CM | POA: Insufficient documentation

## 2013-09-30 MED ORDER — CLINDAMYCIN HCL 150 MG PO CAPS: 150.0000 mg | ORAL_CAPSULE | Freq: Four times a day (QID) | ORAL | Status: DC

## 2013-09-30 MED ORDER — HYDROCODONE-ACETAMINOPHEN 5-325 MG PO TABS: 1.0000 | ORAL_TABLET | ORAL | Status: DC | PRN

## 2013-09-30 NOTE — ED Provider Notes (Signed)
CSN: [id number]     Arrival date & time 09/30/13  1538 History  This chart was scribed for non-physician practitioner, Burgess Amor, PA-C,working with Raeford Razor, MD, by Karle Plumber, ED Scribe. This patient was seen in room APFT24/APFT24 and the patient's care was started at 3:56 PM.  Chief Complaint  Patient presents with  . Dental Pain   Patient is a 28 y.o. male presenting with tooth pain. The history is provided by the patient. No language interpreter was used.  Dental Pain Associated symptoms: facial swelling   Associated symptoms: no fever and no neck pain    HPI Comments:  Ryan Maldonado is a 28 y.o. male who presents to the Emergency Department complaining of severe lower right dental pain secondary to chewing a piece of food about a week ago. He believes a piece of the tooth may have broken off. He states he is experiencing some right-sided facial swelling and feels like the right side of his neck is swollen as well. He reports taking Ibuprofen for the pain with some relief of the pain. Denies nausea, vomiting, difficulty swallowing, fever or chills. Dr. Lovell Sheehan is his dentist and referred him to Dr. Manson Passey, oral surgeon in [place]. He has not been able to see Dr. Manson Passey since he is located in [place] and he has no transportation. Pt is allergic to PCN and tramadol.  History reviewed. No pertinent past medical history. History reviewed. No pertinent past surgical history. History reviewed. No pertinent family history. History  Substance Use Topics  . Smoking status: Current Every Day Smoker -- 1.00 packs/day    Types: Cigarettes  . Smokeless tobacco: Not on file  . Alcohol Use: No     Comment: denies use 12/09/12    Review of Systems  Constitutional: Negative for fever and chills.  HENT: Positive for dental problem and facial swelling. Negative for sore throat and trouble swallowing.   Respiratory: Negative for shortness of breath.   Gastrointestinal: Negative for  nausea and vomiting.  Musculoskeletal: Negative for neck pain and neck stiffness.    Allergies  Penicillins and Tramadol  Home Medications   Prior to Admission medications   Medication Sig Start Date End Date Taking? Authorizing Provider  clindamycin (CLEOCIN) 150 MG capsule Take 1 capsule (150 mg total) by mouth every 6 (six) hours. 09/30/13   Burgess Amor, PA-C  HYDROcodone-acetaminophen (NORCO/VICODIN) 5-325 MG per tablet Take 1 tablet by mouth every 4 (four) hours as needed. 06/17/13   Hope Orlene Och, NP  HYDROcodone-acetaminophen (NORCO/VICODIN) 5-325 MG per tablet Take 1 tablet by mouth every 4 (four) hours as needed. 09/30/13   Burgess Amor, PA-C  meloxicam (MOBIC) 7.5 MG tablet Take 1 tablet (7.5 mg total) by mouth daily. 06/17/13   Hope Orlene Och, NP   Triage Vitals: BP 145/89  Pulse 74  Temp(Src) 97.8 F (36.6 C) (Oral)  Resp 16  Ht 6' (1.829 m)  Wt 150 lb (68.04 kg)  BMI 20.34 kg/m2  SpO2 98% Physical Exam  Constitutional: He is oriented to person, place, and time. He appears well-developed and well-nourished. No distress.  HENT:  Head: Normocephalic and atraumatic.  Right Ear: Tympanic membrane normal.  Left Ear: Tympanic membrane normal.  Mouth/Throat: Oropharynx is clear and moist and mucous membranes are normal. He does not have dentures. No oral lesions. No trismus in the jaw. Abnormal dentition. Dental caries present. No dental abscesses or lacerations.  Poor dentition throughout. Partially edentulous with multiple extractions. Right lower second molar has  severe decay and fracture with surrounding gingival edema and erythema without fluctuant pus pocket.  Eyes: Conjunctivae are normal.  Cardiovascular: Normal rate.   Pulmonary/Chest: Effort normal.  Musculoskeletal: Normal range of motion.  Lymphadenopathy:    He has no cervical adenopathy.  Neurological: He is alert and oriented to person, place, and time.  Skin: Skin is warm and dry. No erythema.  Psychiatric: He has  a normal mood and affect.    ED Course  Procedures (including critical care time) DIAGNOSTIC STUDIES: Oxygen Saturation is 98% on RA, normal by my interpretation.   COORDINATION OF CARE: 4:03 PM- Will give resource guide for pt to find dentist to extract tooth. Pt verbalizes understanding and agrees to plan.  Medications - No data to display  Labs Review Labs Reviewed - No data to display  Imaging Review No results found.   EKG Interpretation None      MDM   Final diagnoses:  Dental abscess    Patient with dental infection, no drainable abscess appreciated.  He was prescribed clindamycin, hydrocodone.  Dental referrals given.  The patient appears reasonably screened and/or stabilized for discharge and I doubt any other medical condition or other [facility] requiring further screening, evaluation, or treatment in the ED at this time prior to discharge.   I personally performed the services described in this documentation, which was scribed in my presence. The recorded information has been reviewed and is accurate.    Burgess Amor, PA-C 10/02/13 2258

## 2013-09-30 NOTE — ED Notes (Signed)
Pt c/o dental pain to right lower tooth after chewing a hush puppy per pt, has seen Dr. Lovell Sheehan and referred to one in [place] to have pulled but pt states unable to get ride there to have done

## 2013-09-30 NOTE — Discharge Instructions (Signed)
Dental Abscess A dental abscess is a collection of infected fluid (pus) from a bacterial infection in the inner part of the tooth (pulp). It usually occurs at the end of the tooth's root.  CAUSES   Severe tooth decay.  Trauma to the tooth that allows bacteria to enter into the pulp, such as a broken or chipped tooth. SYMPTOMS   Severe pain in and around the infected tooth.  Swelling and redness around the abscessed tooth or in the mouth or face.  Tenderness.  Pus drainage.  Bad breath.  Bitter taste in the mouth.  Difficulty swallowing.  Difficulty opening the mouth.  Nausea.  Vomiting.  Chills.  Swollen neck glands. DIAGNOSIS   A medical and dental history will be taken.  An examination will be performed by tapping on the abscessed tooth.  X-rays may be taken of the tooth to identify the abscess. TREATMENT The goal of treatment is to eliminate the infection. You may be prescribed antibiotic medicine to stop the infection from spreading. A root canal may be performed to save the tooth. If the tooth cannot be saved, it may be pulled (extracted) and the abscess may be drained.  HOME CARE INSTRUCTIONS  Only take over-the-counter or prescription medicines for pain, fever, or discomfort as directed by your caregiver.  Rinse your mouth (gargle) often with salt water ( tsp salt in 8 oz [250 ml] of warm water) to relieve pain or swelling.  Do not drive after taking pain medicine (narcotics).  Do not apply heat to the outside of your face.  Return to your dentist for further treatment as directed. SEEK MEDICAL CARE IF:  Your pain is not helped by medicine.  Your pain is getting worse instead of better. SEEK IMMEDIATE MEDICAL CARE IF:  You have a fever or persistent symptoms for more than 2-3 days.  You have a fever and your symptoms suddenly get worse.  You have chills or a very bad headache.  You have problems breathing or swallowing.  You have trouble  opening your mouth.  You have swelling in the neck or around the eye. Document Released: 01/04/2005 Document Revised: 09/29/2011 Document Reviewed: 04/14/2010 [facility] Patient Information 2015 [place], [place]. This information is not intended to replace advice given to you by your health care provider. Make sure you discuss any questions you have with your health care provider.  Complete your entire course of antibiotics as prescribed.  You  may use the hydrocodone for pain relief but do not drive within 4 hours of taking as this will make you drowsy. You may continue taking your ibuprofen  but only every 6 hours, no more often than this.   Avoid applying heat or ice to this abscess area which can worsen your symptoms.  You may use warm salt water swish and spit treatment or half peroxide and water swish and spit after meals to keep this area clean as discussed.  Call the dentist listed above for further management of your symptoms.

## 2013-09-30 NOTE — ED Notes (Signed)
Julie PA in prior to RN, see PA assessment for further,  

## 2013-10-03 NOTE — ED Provider Notes (Signed)
Medical screening examination/treatment/procedure(s) were performed by non-physician practitioner and as supervising physician I was immediately available for consultation/collaboration.   EKG Interpretation None       Avrom Robarts, MD 10/03/13 1237 

## 2014-01-19 ENCOUNTER — Emergency Department (HOSPITAL_COMMUNITY)
Admission: EM | Admit: 2014-01-19 | Discharge: 2014-01-19 | Disposition: A | Discharge status: Home / Self Care | Payer: Medicaid Other | Attending: Emergency Medicine | Admitting: Emergency Medicine

## 2014-01-19 ENCOUNTER — Encounter (HOSPITAL_COMMUNITY): Payer: Self-pay | Admitting: Emergency Medicine

## 2014-01-19 DIAGNOSIS — J029 Acute pharyngitis, unspecified: Secondary | ICD-10-CM | POA: Diagnosis not present

## 2014-01-19 DIAGNOSIS — H9201 Otalgia, right ear: Secondary | ICD-10-CM | POA: Diagnosis not present

## 2014-01-19 DIAGNOSIS — Z880 Allergy status to penicillin: Secondary | ICD-10-CM | POA: Insufficient documentation

## 2014-01-19 DIAGNOSIS — K029 Dental caries, unspecified: Secondary | ICD-10-CM | POA: Diagnosis not present

## 2014-01-19 DIAGNOSIS — K0510 Chronic gingivitis, plaque induced: Secondary | ICD-10-CM | POA: Diagnosis not present

## 2014-01-19 DIAGNOSIS — K0889 Other specified disorders of teeth and supporting structures: Secondary | ICD-10-CM

## 2014-01-19 DIAGNOSIS — Z720 Tobacco use: Secondary | ICD-10-CM | POA: Diagnosis not present

## 2014-01-19 DIAGNOSIS — K088 Other specified disorders of teeth and supporting structures: Secondary | ICD-10-CM | POA: Diagnosis present

## 2014-01-19 DIAGNOSIS — K089 Disorder of teeth and supporting structures, unspecified: Secondary | ICD-10-CM

## 2014-01-19 MED ORDER — ERYTHROMYCIN BASE 250 MG PO TABS: 250.0000 mg | ORAL_TABLET | Freq: Four times a day (QID) | ORAL | Status: DC

## 2014-01-19 MED ORDER — HYDROCODONE-ACETAMINOPHEN 5-325 MG PO TABS: 1.0000 | ORAL_TABLET | Freq: Four times a day (QID) | ORAL | Status: DC | PRN

## 2014-01-19 NOTE — ED Provider Notes (Signed)
CSN: [id number]     Arrival date & time 01/19/14  1800 History   First MD Initiated Contact with Patient 01/19/14 1841     This chart was scribed for No att. providers found by Arlan Organ, ED Scribe. This patient was seen in room APA15/APA15 and the patient's care was started 7:45 PM.   Chief Complaint  Patient presents with  . Dental Pain   The history is provided by the patient. No language interpreter was used.    HPI Comments: Ryan Maldonado is a 29 y.o. male who presents to the Emergency Department complaining of constant R sided lower dental pain that radiates into his R ear and throat onset last night. Pt states he is unable to eat or swallow at this time secondary to discomfort. He also reports a fever of 101. No other associated symptoms at this time. Pt with known allergies to Penicillins and Tramadol.  History reviewed. No pertinent past medical history. History reviewed. No pertinent past surgical history. Family History  Problem Relation Age of Onset  . Cancer Father    History  Substance Use Topics  . Smoking status: Current Every Day Smoker -- 1.00 packs/day for 15 years    Types: Cigarettes  . Smokeless tobacco: Never Used  . Alcohol Use: No    Review of Systems  Constitutional: Positive for fever. Negative for chills.  HENT: Positive for dental problem, ear pain and sore throat (Throat pain).   All other systems reviewed and are negative.     Allergies  Penicillins and Tramadol  Home Medications   Prior to Admission medications   Medication Sig Start Date End Date Taking? Authorizing Provider  erythromycin (E-MYCIN) 250 MG tablet Take 1 tablet (250 mg total) by mouth every 6 (six) hours. 01/19/14   Flint Melter, MD  HYDROcodone-acetaminophen (NORCO) 5-325 MG per tablet Take 1 tablet by mouth every 6 (six) hours as needed for moderate pain. 01/19/14   Flint Melter, MD  Physical Exam  Constitutional: He is oriented to person, place, and time. He  appears well-developed and well-nourished.  HENT:  Head: Normocephalic and atraumatic.  Right Ear: External ear normal.  Left Ear: External ear normal.  No trismus.  Very poor dentition with numerous cavities, right lower molar, with large cavity, and surrounding gum edema, but no fluctuance or drainage.  Mild gingivitis which is diffuse.  Eyes: Conjunctivae and EOM are normal. Pupils are equal, round, and reactive to light.  Neck: Normal range of motion and phonation normal. Neck supple.  Cardiovascular: Normal rate, regular rhythm and normal heart sounds.   Pulmonary/Chest: Effort normal and breath sounds normal. He exhibits no bony tenderness.  Abdominal: Soft. There is no tenderness.  Musculoskeletal: Normal range of motion.  Neurological: He is alert and oriented to person, place, and time. No cranial nerve deficit or sensory deficit. He exhibits normal muscle tone. Coordination normal.  Skin: Skin is warm, dry and intact.  Psychiatric: He has a normal mood and affect. His behavior is normal. Judgment and thought content normal.  Nursing note and vitals reviewed.   ED Course  Procedures (including critical care time)  DIAGNOSTIC STUDIES: Oxygen Saturation is 100% on RA, Normal by my interpretation.    COORDINATION OF CARE:  Medications - No data to display   Patient Vitals for the past 24 hrs:  BP Temp Temp src Pulse Resp SpO2 Height Weight  01/19/14 1816 124/76 mmHg 99.2 F (37.3 C) Oral 100 18 100 %   (1.803 m) 150 lb (68.04 kg)     7:45 PM-Discussed treatment plan with pt at bedside and pt agreed to plan.     Labs Review Labs Reviewed - No data to display  Imaging Review No results found.   EKG Interpretation None      MDM   Final diagnoses:  Toothache  Poor dentition  Gingivitis      Poor dentition with numerous cavities, and dental pain.  Doubt abscess, deep infection or acute fracture.  Nursing Notes Reviewed/ Care Coordinated Applicable  Imaging Reviewed Interpretation of Laboratory Data incorporated into ED treatment  The patient appears reasonably screened and/or stabilized for discharge and I doubt any other medical condition or other [facility] requiring further screening, evaluation, or treatment in the ED at this time prior to discharge.  Plan: Home Medications- Norco, PCN; Home Treatments- rest; return here if the recommended treatment, does not improve the symptoms; Recommended follow up- Dental care asap   I personally performed the services described in this documentation, which was scribed in my presence. The recorded information has been reviewed and is accurate.    Flint Melter, MD 01/19/14 [phone]

## 2014-01-19 NOTE — ED Notes (Addendum)
Patient c/o right lower dental pain/abcess. Patient reports pain radiates into right ear and throat. Patient does report fever of 101. Per patient difficult to swallow.

## 2014-01-19 NOTE — Discharge Instructions (Signed)
You need to see a dentist as soon as possible, for dental care and dental extractions.   Dental Pain A tooth ache may be caused by cavities (tooth decay). Cavities expose the nerve of the tooth to air and hot or cold temperatures. It may come from an infection or abscess (also called a boil or furuncle) around your tooth. It is also often caused by dental caries (tooth decay). This causes the pain you are having. DIAGNOSIS  Your caregiver can diagnose this problem by exam. TREATMENT   If caused by an infection, it may be treated with medications which kill germs (antibiotics) and pain medications as prescribed by your caregiver. Take medications as directed.  Only take over-the-counter or prescription medicines for pain, discomfort, or fever as directed by your caregiver.  Whether the tooth ache today is caused by infection or dental disease, you should see your dentist as soon as possible for further care. SEEK MEDICAL CARE IF: The exam and treatment you received today has been provided on an emergency basis only. This is not a substitute for complete medical or dental care. If your problem worsens or new problems (symptoms) appear, and you are unable to meet with your dentist, call or return to this location. SEEK IMMEDIATE MEDICAL CARE IF:   You have a fever.  You develop redness and swelling of your face, jaw, or neck.  You are unable to open your mouth.  You have severe pain uncontrolled by pain medicine. MAKE SURE YOU:   Understand these instructions.  Will watch your condition.  Will get help right away if you are not doing well or get worse. Document Released: 01/04/2005 Document Revised: 03/29/2011 Document Reviewed: 08/23/2007 [facility] Patient Information 2015 [place], [place]. This information is not intended to replace advice given to you by your health care provider. Make sure you discuss any questions you have with your health care provider.  Gingivitis Gingivitis is  a form of gum (periodontal) disease that causes redness, soreness, and swelling (inflammation) of your gums. CAUSES The most common cause of gingivitis is poor oral hygiene. A sticky substance made of bacteria, mucus, and food particles (plaque), is deposited on the exposed part of teeth. As plaque builds up, it reacts with the saliva in your mouth to form something called  tartar. Tartar is a hard deposit that becomes trapped around the base of the tooth. Plaque and tartar irritate the gums, leading to the formation of gingivitis. Other factors that increase your risk for gingivitis include:   Tobacco use.  Diabetes.  Older age.  Certain medications.  Certain viral or fungal infections.  Dry mouth.  Hormonal changes such as during pregnancy.  Poor nutrition.  Substance abuse.  Poor fitting dental restorations or appliances. SYMPTOMS You may notice inflammation of the soft tissue (gingiva) around the teeth. When these tissues become inflamed, they bleed easily, especially during flossing or brushing. The gums may also be:   Tender to the touch.  Bright red, purple red, or have a shiny appearance.  Swollen.  Wearing away from the teeth (receding), which exposes more of the tooth. Bad breath is often present. Continued infection around teeth can eventually cause cavities and loosen teeth. This may lead to eventual tooth loss. DIAGNOSIS A medical and dental history will be taken. Your mouth, teeth, and gums will be examined. Your dentist will look for soft, swollen purple-red, irritated gums. There may be deposits of plaque and tartar at the base of the teeth. Your gums will  be looked at for the degree of redness, puffiness, and bleeding tendencies. Your dentist will see if any of the teeth are loose. X-rays may be taken to see if the inflammation has spread to the supporting structures of the teeth. TREATMENT The goal is to reduce and reverse the inflammation. Proper treatment can  usually reverse the symptoms of gingivitis and prevent further progression of the disease. Have your teeth cleaned. During the cleaning, all plaque and tartar will be removed. Instruction for proper home care will be given. You will need regular professional cleanings and check-ups in the future. HOME CARE INSTRUCTIONS  Brush your teeth twice a day and floss at least once per day. When flossing, it is best to floss first then brush.  Limit sugar between meals and maintain a well-balanced diet.  Even the best dental hygiene will not prevent plaque from developing. It is necessary for you to see your dentist on a regular basis for cleaning and regular checkups.  Your dentist can recommend proper oral hygiene and mouth care and suggest special toothpastes or mouth rinses.  Stop smoking. SEEK DENTAL OR MEDICAL CARE IF:  You have painful, reddened tissue around your teeth, or you have puffy swollen gums.  You have difficulty chewing.  You notice any loose or infected teeth.  You have swollen glands.  Your gums bleed easily when you brush your teeth or are very tender to the touch. Document Released: 06/30/2000 Document Revised: 03/29/2011 Document Reviewed: 04/10/2010 [facility] Patient Information 2015 [place], [place]. This information is not intended to replace advice given to you by your health care provider. Make sure you discuss any questions you have with your health care provider.    Emergency Department Resource Guide 1) Find a Doctor and Pay Out of Pocket Although you won't have to find out who is covered by your insurance plan, it is a good idea to ask around and get recommendations. You will then need to call the office and see if the doctor you have chosen will accept you as a new patient and what types of options they offer for patients who are self-pay. Some doctors offer discounts or will set up payment plans for their patients who do not have insurance, but you will need to  ask so you aren't surprised when you get to your appointment.  2) Contact Your Local Health Department Not all health departments have doctors that can see patients for sick visits, but many do, so it is worth a call to see if yours does. If you don't know where your local health department is, you can check in your phone book. The CDC also has a tool to help you locate your state's health department, and many state websites also have listings of all of their local health departments.  3) Find a Walk-in Clinic If your illness is not likely to be very severe or complicated, you may want to try a walk in clinic. These are popping up all over the country in pharmacies, drugstores, and shopping centers. They're usually staffed by nurse practitioners or physician assistants that have been trained to treat common illnesses and complaints. They're usually fairly quick and inexpensive. However, if you have serious medical issues or chronic medical problems, these are probably not your best option.  No Primary Care Doctor: - Call Health Connect at  [phone] - they can help you locate a primary care doctor that  accepts your insurance, provides certain services, etc. - Physician Referral Service- [phone]  Chronic Pain Problems: Organization         Address  Phone   Notes  [facility]  [phone] Patients need to be referred by their primary care doctor.   Medication Assistance: Organization         Address  Phone   Notes  [facility] Medication [facility] [address] [place], [place] [address] [phone] --Must be a resident of [facility] -- Must have NO insurance coverage whatsoever (no Medicaid/ Medicare, etc.) -- The pt. MUST have a primary care doctor that directs their care regularly and follows them in the community   MedAssist  [phone]   [employer]  [phone]    Agencies that provide inexpensive medical  care: Organization         Address  Phone   Notes  [facility]  [phone]   [facility]    [phone]   [facility] [address] [place], [place] [address] [phone]   [facility] [address] [place] [place] [phone]   Planned Parenthood    [phone]   [facility]    [phone]   [facility]  [address], [place] Phone:  [phone], Fax:  [phone] Hours of Operation:  9 am - 6 pm, M-F.  Also accepts Medicaid/Medicare and self-pay.  [facility]  [address], [place] Phone: [phone], Fax: [phone]. Hours of Operation:  8:30 am - 5:30 pm, M-F.  Also accepts Medicaid and self-pay.  [facility] [place] [address] [place] Phone: [phone]   [facility] [address] Natasha Bence [place], [place] [phone], Ext. [phone] Mondays & Thursdays: 7-9 AM.  First 15 patients are seen on a first come, first serve basis.    Medicaid-accepting [facility] Providers:  Organization         Address  Phone   Notes  [facility] [address], [place] [phone] Also accepts self-pay patients.  [facility] [address] Laurell Josephs [place], [place]  [phone]   [facility] [address], [place] [phone]   [facility] [address] [place] [phone]   Renaye Rakers [address], [place]   [phone] Only accepts [place] Access [place] patients after they have their name applied to their card.   Self-Pay (no insurance) in [facility]:  Organization         Address  Phone   Notes  Sickle Cell Patients, [facility] Internal Medicine [address] [place], [place] [phone]   [facility] Urgent Care [address] [place],  [place] [phone]   [facility] [place]  [address], [place] [phone]   [facility]/Dr. Osei-Bonsu  [address], [place] or [address], [place] [phone] Phone number for both [place] and [place] locations is the same.  [facility] [address], [place] [phone]   [facility] [address] [place] or [address] [phone]   [facility] [address] [place] [phone], phone; [phone], fax Sees  patients 1st and 3rd Saturday of every month.  Must not qualify for public or private insurance (i.e. Medicaid, Medicare, [place] Health Choice, Veterans' Benefits)  Household income should be no more than 200% of the poverty level The clinic cannot treat you if you are pregnant or think you are pregnant  Sexually transmitted diseases are not treated at the clinic.    Dental Care: Organization         Address  Phone  Notes  [facility] [address] [place], [place] [phone] Accepts children up to age 81 who are enrolled in [place] or [place] Health Choice; pregnant women with a Medicaid card; and children who have applied for Medicaid or [place] Health Choice, but were declined, whose parents can pay a reduced fee at time of service.  [facility]  [address], [place] [phone] Accepts children up to age 53 who are enrolled in [place] or [place] Health Choice; pregnant women with a Medicaid card; and children who have applied for Medicaid or [place] Health Choice, but were declined, whose parents can pay a reduced fee at time of service.  [facility]  [address] [place], [place] [phone] Patients are seen by appointment only. Walk-ins are not accepted. [facility] will see patients 8 years of age and older. Monday - Tuesday (8am-5pm) Most Wednesdays (8:30-5pm) $30 per visit, cash only  [facility] Adult [facility] Access [facility]  [address], [facility] [phone] Patients are seen by appointment only. Walk-ins are not accepted. [facility] will see patients 84 years of age and older. One Wednesday Evening (Monthly: Volunteer Based).  $30 per visit, cash only  [employer]  [phone] for adults; Children under age 25, call Graduate Pediatric Dentistry at [phone]. Children aged 84-14, please call [phone] to request a pediatric application.  Dental services are provided in all areas of dental care including fillings, crowns and bridges, complete and partial dentures, implants, gum treatment, root canals, and extractions. Preventive care is also provided. Treatment is provided to both adults and children. Patients are selected via a lottery and there is often a waiting list.   [facility] [address], [place]  [phone] [email or web address]   [facility] [address] [place], [place] [phone], Ext. [phone] Second and Fourth Thursday of each month, opens at 6:30 AM; Clinic ends at 9 AM.  Patients are seen on a first-come first-served basis, and a limited number are seen during each clinic.   [facility]  [address] Ether Griffins [place], [place] [phone]   Eligibility Requirements You must have lived in [place], [place], or [place] counties for at least the last three months.   You cannot be eligible for state or federal sponsored [place], including [employer], [place], or [employer].   You generally cannot be eligible for healthcare insurance through your employer.    How to apply: Eligibility screenings are held every Tuesday and Wednesday afternoon from 1:00 pm until 4:00 pm. You do not need an appointment for the interview!   [facility] [address] [place], [place] [phone]   [facility]  [phone]   [facility]  [phone]   [facility] Health Department  [phone]    Behavioral Health Resources in the Community: Intensive Outpatient Programs  Organization         Address  Phone  Notes  [facility] [address] [place], [place] [phone]   [facility] Outpatient [address] [place], [place] [phone]   [facility] [address] [place], [place]  [phone]   [facility] [address],  [place], [place] [phone] or [phone]   Substance Abuse Resources Organization         Address  Phone  Notes  [facility]  [phone]   [facility]  [phone]   [facility]  [phone]   Floydene Flock  [phone]   Residential & Outpatient Substance Abuse Program  [phone]   Psychological Services Organization         Address  Phone  Notes  [facility] Behavioral Health  [phone]   [facility] Services  [phone]   [facility] Mental Health [address], [place] [phone] or [phone]    Mobile Crisis Teams Organization         Address  Phone  Notes  [facility], Mobile Crisis Care Unit  [phone]   [facility]  [address] [place], [place] [phone]   Doristine Locks [address] [place] [phone]    Self-Help/Support Groups Organization         Address  Phone             Notes  [facility] [place] - variety of support groups  336- I7437963 Call for more information  Narcotics Anonymous (NA), [facility] [address], [employer] [place]  2 meetings at this location   Statistician         Address  Phone  Notes  [facility] [address],    [place]  [phone]   [facility]  [address] [place] [address], [place], [place] [phone]   [facility] [address] [place], [place] [phone] Admissions: 8am-3pm M-F  [facility] [address]    [place], [place] [phone]   [facility] [address] [place], [place], [place] [phone]   [facility] [address],  [place], [place] [phone]   [facility] - Intensive Outpatient [address] Laurell Josephs [address], [place], [place] [phone]   [facility] (Addiction [facility] [address] [place]  [place], [place] [phone] or [phone]   [facility] ([facility]) [address] [place], [place] [phone] Accepts Medicaid  [facility] [address]  [place] [phone] Substance Abuse/Addiction Treatment   [facility] Organization         Address  Phone  Notes  [facility]  [phone]   Angie Fava, PhD [address] Ervin Knack [place], [place]   [phone] or [phone]   [facility] Behavioral   [address] [place], [place] [phone]   [facility] [address] [place], [place] [phone] Insurance/Medicaid/sponsorship through [facility] and Families [address]                                    [place], [place] [phone] Therapy/tele-psych/case  [facility] [address] [place] [phone]    Dr. Lolly Mustache  (  336) [phone]   [facility] [place]  [facility] [place] [facility] 1) [address], [place] 2) [address], [place] 3)  [address], [place] [phone](336) [phone]  [phone](336) [phone]   [facility] [place] [place] Child Abuse Hotline 9184204841(336) [phone] or [phone](336) [phone] (After Hours)

## 2015-09-06 ENCOUNTER — Emergency Department (HOSPITAL_COMMUNITY)
Admission: EM | Admit: 2015-09-06 | Discharge: 2015-09-06 | Disposition: A | Discharge status: Home / Self Care | Payer: No Typology Code available for payment source | Attending: Emergency Medicine | Admitting: Emergency Medicine

## 2015-09-06 ENCOUNTER — Encounter (HOSPITAL_COMMUNITY): Payer: Self-pay | Admitting: *Deleted

## 2015-09-06 ENCOUNTER — Emergency Department (HOSPITAL_COMMUNITY): Payer: No Typology Code available for payment source

## 2015-09-06 DIAGNOSIS — S46912A Strain of unspecified muscle, fascia and tendon at shoulder and upper arm level, left arm, initial encounter: Secondary | ICD-10-CM | POA: Diagnosis not present

## 2015-09-06 DIAGNOSIS — Z791 Long term (current) use of non-steroidal anti-inflammatories (NSAID): Secondary | ICD-10-CM | POA: Diagnosis not present

## 2015-09-06 DIAGNOSIS — F17210 Nicotine dependence, cigarettes, uncomplicated: Secondary | ICD-10-CM | POA: Diagnosis not present

## 2015-09-06 DIAGNOSIS — Z79899 Other long term (current) drug therapy: Secondary | ICD-10-CM | POA: Insufficient documentation

## 2015-09-06 DIAGNOSIS — Y92481 Parking lot as the place of occurrence of the external cause: Secondary | ICD-10-CM | POA: Diagnosis not present

## 2015-09-06 DIAGNOSIS — Y939 Activity, unspecified: Secondary | ICD-10-CM | POA: Insufficient documentation

## 2015-09-06 DIAGNOSIS — Y999 Unspecified external cause status: Secondary | ICD-10-CM | POA: Insufficient documentation

## 2015-09-06 DIAGNOSIS — S4992XA Unspecified injury of left shoulder and upper arm, initial encounter: Secondary | ICD-10-CM | POA: Diagnosis present

## 2015-09-06 MED ORDER — IBUPROFEN 800 MG PO TABS: 800.0000 mg | ORAL_TABLET | Freq: Three times a day (TID) | ORAL | 0 refills | Status: DC

## 2015-09-06 MED ORDER — CYCLOBENZAPRINE HCL 10 MG PO TABS: 10.0000 mg | ORAL_TABLET | Freq: Three times a day (TID) | ORAL | 0 refills | Status: DC | PRN

## 2015-09-06 MED ORDER — CYCLOBENZAPRINE HCL 10 MG PO TABS
10.0000 mg | ORAL_TABLET | Freq: Once | ORAL | Status: AC
  Administered 2015-09-06: 10 mg via ORAL
  Filled 2015-09-06: qty 1

## 2015-09-06 MED ORDER — IBUPROFEN 800 MG PO TABS
800.0000 mg | ORAL_TABLET | Freq: Once | ORAL | Status: AC
  Administered 2015-09-06: 800 mg via ORAL
  Filled 2015-09-06: qty 1
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
## 2015-09-06 NOTE — Discharge Instructions (Signed)
Apply ice packs on/off to your neck and shoulder.  Follow-up with your doctor or call Dr. Mort SawyersHarrison's office to arrange a follow-up appt.
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
## 2015-12-28 ENCOUNTER — Emergency Department (HOSPITAL_COMMUNITY)
Admission: EM | Admit: 2015-12-28 | Discharge: 2015-12-28 | Disposition: A | Discharge status: Home / Self Care | Payer: Medicaid Other | Attending: Emergency Medicine | Admitting: Emergency Medicine

## 2015-12-28 ENCOUNTER — Encounter (HOSPITAL_COMMUNITY): Payer: Self-pay | Admitting: Emergency Medicine

## 2015-12-28 DIAGNOSIS — Z791 Long term (current) use of non-steroidal anti-inflammatories (NSAID): Secondary | ICD-10-CM | POA: Diagnosis not present

## 2015-12-28 DIAGNOSIS — F17210 Nicotine dependence, cigarettes, uncomplicated: Secondary | ICD-10-CM | POA: Diagnosis not present

## 2015-12-28 DIAGNOSIS — J019 Acute sinusitis, unspecified: Secondary | ICD-10-CM | POA: Diagnosis not present

## 2015-12-28 DIAGNOSIS — R05 Cough: Secondary | ICD-10-CM | POA: Diagnosis present

## 2015-12-28 MED ORDER — CLINDAMYCIN HCL 300 MG PO CAPS: 300.0000 mg | ORAL_CAPSULE | Freq: Four times a day (QID) | ORAL | 0 refills | Status: DC

## 2015-12-28 MED ORDER — HYDROCODONE-ACETAMINOPHEN 5-325 MG PO TABS: 2.0000 | ORAL_TABLET | ORAL | 0 refills | Status: DC | PRN

## 2015-12-28 NOTE — ED Triage Notes (Signed)
Pt here for fever, cough, nasal congestion x 1 week.

## 2015-12-28 NOTE — Discharge Instructions (Signed)
See your Physician for recheck in 1 week if pain persist °

## 2015-12-28 NOTE — ED Provider Notes (Signed)
[facility] Provider Note   CSN: [id number] Arrival date & time: 12/28/15  1814   By signing my name below, I, Teofilo PodMatthew P. Jamison, attest that this documentation has been prepared under the direction and in the presence of Langston MaskerKaren Corrina Steffensen, [place]PA-C. Electronically Signed: Teofilo PodMatthew P. Jamison, ED Scribe. 12/28/2015. 7:52 PM.   History   Chief Complaint Chief Complaint  Patient presents with  . Cough   The history is provided by the patient. No language interpreter was used.   HPI Comments:  Ryan Maldonado is a 30 y.o. male who presents to the Emergency Department complaining of multiple URI symptoms x 1 week. Pt complains of associated right sided facial swelling, intermittent fever, a dental problem, productive cough with green sputum, and nasal congestion. Pt is a smoker. Pt reports an allergy to sulfa drugs, tramadol, and penicillin. No alleviating factors noted. Pt denies other associated symptoms.   History reviewed. No pertinent past medical history.  There are no active problems to display for this patient.   History reviewed. No pertinent surgical history.     Home Medications    Prior to Admission medications   Medication Sig Start Date End Date Taking? Authorizing Provider  cyclobenzaprine (FLEXERIL) 10 MG tablet Take 1 tablet (10 mg total) by mouth 3 (three) times daily as needed. 09/06/15   Tammy Triplett, PA-C  ibuprofen (ADVIL,MOTRIN) 800 MG tablet Take 1 tablet (800 mg total) by mouth 3 (three) times daily. 09/06/15   Tammy Triplett, PA-C    Family History Family History  Problem Relation Age of Onset  . Cancer Father     Social History Social History  Substance Use Topics  . Smoking status: Current Every Day Smoker    Packs/day: 1.00    Years: 15.00    Types: Cigarettes  . Smokeless tobacco: Never Used  . Alcohol use No     Allergies   Tramadol and Penicillins   Review of Systems Review of Systems  Constitutional: Positive for fever.    HENT: Positive for congestion, dental problem and facial swelling.   Respiratory: Positive for cough.   All other systems reviewed and are negative.    Physical Exam Updated Vital Signs BP 152/84 (BP Location: Left Arm)   Pulse 103   Temp 98.9 F (37.2 C) (Oral)   Resp 18   Ht 5\' 11"  (1.803 m)   Wt 145 lb (65.8 kg)   SpO2 100%   BMI 20.22 kg/m   Physical Exam  Constitutional: He appears well-developed and well-nourished. No distress.  HENT:  Head: Normocephalic and atraumatic.  Eyes: Conjunctivae are normal.  Cardiovascular: Normal rate.   Pulmonary/Chest: Effort normal.  Abdominal: He exhibits no distension.  Neurological: He is alert.  Skin: Skin is warm and dry.  Psychiatric: He has a normal mood and affect.  Nursing note and vitals reviewed.    ED Treatments / Results  DIAGNOSTIC STUDIES:  Oxygen Saturation is 100% on RA, normal by my interpretation.    COORDINATION OF CARE:  7:52 PM Discussed treatment plan with pt at bedside and pt agreed to plan.   Labs (all labs ordered are listed, but only abnormal results are displayed) Labs Reviewed - No data to display  EKG  EKG Interpretation None       Radiology No results found.  Procedures Procedures (including critical care time)  Medications Ordered in ED Medications - No data to display   Initial Impression / Assessment and Plan / ED Course  I  have reviewed the triage vital signs and the nursing notes.  Pertinent labs & imaging results that were available during my care of the patient were reviewed by me and considered in my medical decision making (see chart for details).  Clinical Course       Final Clinical Impressions(s) / ED Diagnoses   Final diagnoses:  Acute non-recurrent sinusitis, unspecified location    New Prescriptions Discharge Medication List as of 12/28/2015  8:04 PM    START taking these medications   Details  clindamycin (CLEOCIN) 300 MG capsule Take 1 capsule  (300 mg total) by mouth every 6 (six) hours., Starting Sun 12/28/2015, Print    HYDROcodone-acetaminophen (NORCO/VICODIN) 5-325 MG tablet Take 2 tablets by mouth every 4 (four) hours as needed., Starting Sun 12/28/2015, Print       I personally performed the services in this documentation, which was scribed in my presence.  The recorded information has been reviewed and considered.   Barnet PallKaren SofiaPAC.   Lonia SkinnerLeslie K Miami GardensSofia, PA-C 12/28/15 2111    Samuel JesterKathleen McManus, DO 01/01/16 2102

## 2016-04-17 ENCOUNTER — Emergency Department (HOSPITAL_COMMUNITY): Payer: Medicaid Other

## 2016-04-17 ENCOUNTER — Encounter (HOSPITAL_COMMUNITY): Payer: Self-pay | Admitting: *Deleted

## 2016-04-17 ENCOUNTER — Emergency Department (HOSPITAL_COMMUNITY)
Admission: EM | Admit: 2016-04-17 | Discharge: 2016-04-17 | Disposition: A | Discharge status: Home / Self Care | Payer: Medicaid Other | Attending: Emergency Medicine | Admitting: Emergency Medicine

## 2016-04-17 DIAGNOSIS — Z791 Long term (current) use of non-steroidal anti-inflammatories (NSAID): Secondary | ICD-10-CM | POA: Diagnosis not present

## 2016-04-17 DIAGNOSIS — X58XXXA Exposure to other specified factors, initial encounter: Secondary | ICD-10-CM | POA: Diagnosis not present

## 2016-04-17 DIAGNOSIS — T1592XA Foreign body on external eye, part unspecified, left eye, initial encounter: Secondary | ICD-10-CM

## 2016-04-17 DIAGNOSIS — Y929 Unspecified place or not applicable: Secondary | ICD-10-CM | POA: Insufficient documentation

## 2016-04-17 DIAGNOSIS — Y999 Unspecified external cause status: Secondary | ICD-10-CM | POA: Diagnosis not present

## 2016-04-17 DIAGNOSIS — Y939 Activity, unspecified: Secondary | ICD-10-CM | POA: Diagnosis not present

## 2016-04-17 DIAGNOSIS — F17210 Nicotine dependence, cigarettes, uncomplicated: Secondary | ICD-10-CM | POA: Diagnosis not present

## 2016-04-17 DIAGNOSIS — T1502XA Foreign body in cornea, left eye, initial encounter: Secondary | ICD-10-CM | POA: Insufficient documentation

## 2016-04-17 MED ORDER — FLUORESCEIN SODIUM 0.6 MG OP STRP
ORAL_STRIP | OPHTHALMIC | Status: AC
  Filled 2016-04-17: qty 1

## 2016-04-17 MED ORDER — TOBRAMYCIN 0.3 % OP SOLN
2.0000 [drp] | Freq: Once | OPHTHALMIC | Status: AC
  Administered 2016-04-17: 2 [drp] via OPHTHALMIC
  Filled 2016-04-17: qty 5

## 2016-04-17 MED ORDER — HYDROCODONE-ACETAMINOPHEN 5-325 MG PO TABS: 1.0000 | ORAL_TABLET | ORAL | 0 refills | Status: DC | PRN

## 2016-04-17 MED ORDER — ONDANSETRON HCL 4 MG PO TABS
4.0000 mg | ORAL_TABLET | Freq: Once | ORAL | Status: AC
  Administered 2016-04-17: 4 mg via ORAL
  Filled 2016-04-17: qty 1

## 2016-04-17 MED ORDER — MELOXICAM 15 MG PO TABS: 15.0000 mg | ORAL_TABLET | Freq: Every day | ORAL | 0 refills | Status: DC

## 2016-04-17 MED ORDER — TETRACAINE HCL 0.5 % OP SOLN
OPHTHALMIC | Status: AC
  Filled 2016-04-17: qty 4

## 2016-04-17 MED ORDER — KETOROLAC TROMETHAMINE 10 MG PO TABS
10.0000 mg | ORAL_TABLET | Freq: Once | ORAL | Status: AC
  Administered 2016-04-17: 10 mg via ORAL
  Filled 2016-04-17: qty 1

## 2016-04-17 MED ORDER — ACETAMINOPHEN 325 MG PO TABS
650.0000 mg | ORAL_TABLET | Freq: Once | ORAL | Status: AC
  Administered 2016-04-17: 650 mg via ORAL
  Filled 2016-04-17: qty 2

## 2016-04-17 NOTE — ED Triage Notes (Signed)
Pt reports 3-4 day history of feeling like something in his left eye- states he was cutting metal and he has tried to wash it out without success

## 2016-04-17 NOTE — ED Notes (Signed)
Visual accuity: OD-20/20 OS-20/20 OU 20/20

## 2016-04-17 NOTE — ED Notes (Signed)
ED Provider at bedside. 

## 2016-04-17 NOTE — ED Provider Notes (Signed)
[facility] Provider Note   CSN: [id number] Arrival date & time: 04/17/16  1101     History   Chief Complaint Chief Complaint  Patient presents with  . Foreign Body in Eye    HPI Ryan Maldonado is a 31 y.o. male.  Patient is a 31 year old male who presents to the emergency department with a complaint of something in the left eye.  The patient states that he was cutting metal about 3 or 4 days ago when something flew in his eye. He has washed his eye out several times over the last to 3 days. He states he still feels as though there's something in there so he came to the emergency department for evaluation. He denies any vision changes. He states however that bright lights do seem to bother his eyes. Patient states he is up-to-date on his tetanus shot. He's not had any previous operations or procedures involving his eyes.   The history is provided by the patient.  Foreign Body in Eye     History reviewed. No pertinent past medical history.  There are no active problems to display for this patient.   History reviewed. No pertinent surgical history.     Home Medications    Prior to Admission medications   Medication Sig Start Date End Date Taking? Authorizing Provider  clindamycin (CLEOCIN) 300 MG capsule Take 1 capsule (300 mg total) by mouth every 6 (six) hours. 12/28/15   Elson Areas, PA-C  cyclobenzaprine (FLEXERIL) 10 MG tablet Take 1 tablet (10 mg total) by mouth 3 (three) times daily as needed. 09/06/15   Tammy Triplett, PA-C  HYDROcodone-acetaminophen (NORCO/VICODIN) 5-325 MG tablet Take 2 tablets by mouth every 4 (four) hours as needed. 12/28/15   Elson Areas, PA-C  ibuprofen (ADVIL,MOTRIN) 800 MG tablet Take 1 tablet (800 mg total) by mouth 3 (three) times daily. 09/06/15   Tammy Triplett, PA-C    Family History Family History  Problem Relation Age of Onset  . Cancer Father     Social History Social History  Substance Use Topics  . Smoking  status: Current Every Day Smoker    Packs/day: 1.00    Years: 15.00    Types: Cigarettes  . Smokeless tobacco: Never Used  . Alcohol use No     Allergies   Tramadol and Penicillins   Review of Systems Review of Systems  Eyes: Positive for photophobia, pain and redness.  All other systems reviewed and are negative.    Physical Exam Updated Vital Signs BP (!) 149/81   Pulse 80   Temp 98.9 F (37.2 C) (Temporal)   Resp 16   Ht  (1.803 m)   Wt 63.5 kg   SpO2 100%   BMI 19.53 kg/m   Physical Exam  Constitutional: He is oriented to person, place, and time. He appears well-developed and well-nourished.  Non-toxic appearance.  HENT:  Head: Normocephalic.  Right Ear: Tympanic membrane and external ear normal.  Left Ear: Tympanic membrane and external ear normal.  Eyes: EOM and lids are normal. Pupils are equal, round, and reactive to light. Lids are everted and swept, no foreign bodies found. Left conjunctiva is injected.  Fundoscopic exam:      The left eye shows no AV nicking, no hemorrhage and no papilledema.  Slit lamp exam:      The right eye shows no hyphema and no anterior chamber bulge.       The left eye shows foreign body. The  left eye shows no hyphema and no anterior chamber bulge.  Neck: Normal range of motion. Neck supple. Carotid bruit is not present.  Cardiovascular: Normal rate, regular rhythm, normal heart sounds, intact distal pulses and normal pulses.   Pulmonary/Chest: Breath sounds normal. No respiratory distress.  Abdominal: Soft. Bowel sounds are normal. There is no tenderness. There is no guarding.  Musculoskeletal: Normal range of motion.  Lymphadenopathy:       Head (right side): No submandibular adenopathy present.       Head (left side): No submandibular adenopathy present.    He has no cervical adenopathy.  Neurological: He is alert and oriented to person, place, and time. He has normal strength. No cranial nerve deficit or sensory  deficit.  Skin: Skin is warm and dry.  Psychiatric: He has a normal mood and affect. His speech is normal.  Nursing note and vitals reviewed.    ED Treatments / Results  Labs (all labs ordered are listed, but only abnormal results are displayed) Labs Reviewed - No data to display  EKG  EKG Interpretation None       Radiology No results found.  Procedures .Foreign Body Removal Date/Time: 04/17/2016 1:40 PM Performed by: Ivery Quale Authorized by: Ivery Quale  Consent: Verbal consent obtained. Risks and benefits: risks, benefits and alternatives were discussed Consent given by: patient Patient understanding: patient states understanding of the procedure being performed Patient identity confirmed: arm band Time out: Immediately prior to procedure a "time out" was called to verify the correct patient, procedure, equipment, support staff and site/side marked as required. Body area: eye Location details: left cornea  Anesthesia: Local Anesthetic: tetracaine drops  Sedation: Patient sedated: no Localization method: slit lamp Removal mechanism: 25-gauge needle and moist cotton swab Eye examined with fluorescein. Fluorescein uptake. Corneal abrasion size: small Residual rust ring present. Residual rust ring removed. Dressing: antibiotic drops Depth: embedded Complexity: simple 1 objects recovered. Objects recovered: shard of metal Post-procedure assessment: foreign body removed Patient tolerance: Patient tolerated the procedure well with no immediate complications   (including critical care time)  Medications Ordered in ED Medications  tetracaine (PONTOCAINE) 0.5 % ophthalmic solution (not administered)  fluorescein 0.6 MG ophthalmic strip (not administered)  ketorolac (TORADOL) tablet 10 mg (not administered)  acetaminophen (TYLENOL) tablet 650 mg (not administered)  ondansetron (ZOFRAN) tablet 4 mg (not administered)  tobramycin (TOBREX) 0.3 % ophthalmic  solution 2 drop (not administered)     Initial Impression / Assessment and Plan / ED Course  I have reviewed the triage vital signs and the nursing notes.  Pertinent labs & imaging results that were available during my care of the patient were reviewed by me and considered in my medical decision making (see chart for details).     **I have reviewed nursing notes, vital signs, and all appropriate lab and imaging results for this patient.*  Final Clinical Impressions(s) / ED Diagnoses MDM Patient states his tetanus is up-to-date. Vital signs within normal limits. Visual acuity is 20/20. The foreign body was removed as well as the rust ring. 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  CT scan shows no abnormality of either close for visit. There is no radiopaque foreign body appreciated at this time.  The patient is treated with tobramycin ophthalmic drops. Prescription for Mobic given, for inflammation. Patient will use dark glasses and a hat with brim. The patient has been advised to avoid  sudden light changes if possible. Patient will see his ophthalmology specialist or return to the emergency department if any changes, problems, or concerns.    Final diagnoses:  Foreign body, eye, left, initial encounter    New Prescriptions Discharge Medication List as of 04/17/2016  2:35 PM    START taking these medications   Details  meloxicam (MOBIC) 15 MG tablet Take 1 tablet (15 mg total) by mouth daily., Starting Sat 04/17/2016, Print         Ivery Quale, PA-C 04/20/16 1726    Donnetta Hutching, MD 04/22/16 [phone]

## 2016-04-17 NOTE — Discharge Instructions (Signed)
Foreign body to your left eye has been removed. The CT scan of your orbits shows no additional foreign body, and no injury to the globe of URI or any of the other portions of your eye. Please use 2 drops of tobramycin in the left eye every 4 hours for the next 5 days. Please use dark glasses to protect her eyes are bright light. Please see your eye specialist if any unusual pus like drainage, or excessive headaches. Use Tylenol or ibuprofen for mild pain, use Norco for more severe pain.

## 2018-07-20 ENCOUNTER — Emergency Department (HOSPITAL_COMMUNITY)
Admission: EM | Admit: 2018-07-20 | Discharge: 2018-07-20 | Disposition: A | Discharge status: Home / Self Care | Payer: Medicaid Other | Attending: Emergency Medicine | Admitting: Emergency Medicine

## 2018-07-20 ENCOUNTER — Other Ambulatory Visit: Payer: Self-pay

## 2018-07-20 ENCOUNTER — Encounter (HOSPITAL_COMMUNITY): Payer: Self-pay | Admitting: Emergency Medicine

## 2018-07-20 ENCOUNTER — Emergency Department (HOSPITAL_COMMUNITY): Payer: Medicaid Other

## 2018-07-20 DIAGNOSIS — S60221A Contusion of right hand, initial encounter: Secondary | ICD-10-CM

## 2018-07-20 DIAGNOSIS — S66394A Other injury of extensor muscle, fascia and tendon of right ring finger at wrist and hand level, initial encounter: Secondary | ICD-10-CM | POA: Insufficient documentation

## 2018-07-20 DIAGNOSIS — S6991XA Unspecified injury of right wrist, hand and finger(s), initial encounter: Secondary | ICD-10-CM | POA: Diagnosis present

## 2018-07-20 DIAGNOSIS — Y999 Unspecified external cause status: Secondary | ICD-10-CM | POA: Diagnosis not present

## 2018-07-20 DIAGNOSIS — Y9389 Activity, other specified: Secondary | ICD-10-CM | POA: Insufficient documentation

## 2018-07-20 DIAGNOSIS — Y9289 Other specified places as the place of occurrence of the external cause: Secondary | ICD-10-CM | POA: Diagnosis not present

## 2018-07-20 DIAGNOSIS — W208XXA Other cause of strike by thrown, projected or falling object, initial encounter: Secondary | ICD-10-CM | POA: Insufficient documentation

## 2018-07-20 DIAGNOSIS — M20011 Mallet finger of right finger(s): Secondary | ICD-10-CM

## 2018-07-20 DIAGNOSIS — F17210 Nicotine dependence, cigarettes, uncomplicated: Secondary | ICD-10-CM | POA: Diagnosis not present

## 2018-07-20 MED ORDER — IBUPROFEN 800 MG PO TABS
800.0000 mg | ORAL_TABLET | Freq: Once | ORAL | Status: AC
  Administered 2018-07-20: 16:00:00 800 mg via ORAL
  Filled 2018-07-20: qty 1

## 2018-07-20 MED ORDER — ACETAMINOPHEN 325 MG PO TABS
650.0000 mg | ORAL_TABLET | Freq: Once | ORAL | Status: AC
  Administered 2018-07-20: 650 mg via ORAL
  Filled 2018-07-20: qty 2

## 2018-07-20 MED ORDER — PROMETHAZINE HCL 12.5 MG PO TABS: 12.5000 mg | ORAL_TABLET | Freq: Four times a day (QID) | ORAL | 0 refills | Status: DC | PRN

## 2018-07-20 MED ORDER — TRAMADOL HCL 50 MG PO TABS: 50.0000 mg | ORAL_TABLET | Freq: Four times a day (QID) | ORAL | 0 refills | Status: DC | PRN

## 2018-07-20 NOTE — ED Notes (Addendum)
Received phone call from patient's wife Ryan Maldonado). Pt's wife immediately began yelling and cursing at this RN. She was stating that pt was just d/c from the ED and she was upset that he was prescribed tramadol. She stated that patient had an allergic reaction to tramadol and it caused a severe allergic reaction. I attempted to calm her down, but she continued to yell. When I was able to speak, I told her that I would be happy to speak with PA that saw the patient. I took down her information and told her I would call her back. Spoke with PA Lily Kocher, who stated that he had a conversation with the patient. The patient had just reported that the medication caused some itching, but mostly nausea. PA stated he told that patient that is why he prescribed the phenergan to go with with. PA stated he would not be able to send over another prescription for narcotic pain medication. This RN called her back to explain to wife what was communicated by the PA. Wife became irate again, threatening and cursing at this RN stating that she was there when he took that medication and stated, "I almost watched him die." Offered to give her patient experience number as there was nothing further I could do at this moment, but she continued to yell and threaten this RN and [place]. AC notified.

## 2018-07-20 NOTE — Discharge Instructions (Signed)
Vital signs are non-acute.

## 2018-07-20 NOTE — ED Triage Notes (Signed)
Patient complaining of right hand pain after dropping "something" on it last night.

## 2018-07-20 NOTE — ED Notes (Signed)
Pt unable to sign due to signature pad being down. NAD. Verbalized understanding

## 2018-07-20 NOTE — ED Provider Notes (Signed)
[facility] EMERGENCY DEPARTMENT Provider Note   CSN: [id number] Arrival date & time: 07/20/18  1331     History   Chief Complaint Chief Complaint  Patient presents with  . Hand Injury    HPI Ryan Maldonado is a 33 y.o. male.     Patient is a 33 year old male who presents to the emergency department with a complaint of right hand injury.  The patient states that on yesterday, July 1, he was working on his car when an alternator fell on the right hand.  The patient is left-hand dominant.  He has pain with movement of the hand today.  He particularly has pain of the thumb and at the area of the wrist at the base of the thumb.  He also has injury to the fingers, particularly the ring finger.  Patient denies being on any anticoagulation medications.  There is been no recent operations or procedures involving the hand.  No other injury is reported.  The history is provided by the patient.    History reviewed. No pertinent past medical history.  There are no active problems to display for this patient.   History reviewed. No pertinent surgical history.      Home Medications    Prior to Admission medications   Medication Sig Start Date End Date Taking? Authorizing Provider  clindamycin (CLEOCIN) 300 MG capsule Take 1 capsule (300 mg total) by mouth every 6 (six) hours. 12/28/15   Elson AreasSofia, Leslie K, PA-C  cyclobenzaprine (FLEXERIL) 10 MG tablet Take 1 tablet (10 mg total) by mouth 3 (three) times daily as needed. 09/06/15   Triplett, Tammy, PA-C  HYDROcodone-acetaminophen (NORCO/VICODIN) 5-325 MG tablet Take 1 tablet by mouth every 4 (four) hours as needed. 04/17/16   Ivery QualeBryant, Cynethia Schindler, PA-C  ibuprofen (ADVIL,MOTRIN) 800 MG tablet Take 1 tablet (800 mg total) by mouth 3 (three) times daily. 09/06/15   Triplett, Tammy, PA-C  meloxicam (MOBIC) 15 MG tablet Take 1 tablet (15 mg total) by mouth daily. 04/17/16   Ivery QualeBryant, Ruddy Swire, PA-C    Family History Family History  Problem Relation Age  of Onset  . Cancer Father     Social History Social History   Tobacco Use  . Smoking status: Current Every Day Smoker    Packs/day: 1.00    Years: 15.00    Pack years: 15.00    Types: Cigarettes  . Smokeless tobacco: Never Used  Substance Use Topics  . Alcohol use: No  . Drug use: No     Allergies   Tramadol and Penicillins   Review of Systems Review of Systems  Constitutional: Negative for activity change.       All ROS Neg except as noted in HPI  HENT: Negative.   Eyes: Negative for photophobia and discharge.  Respiratory: Negative for cough, shortness of breath and wheezing.   Cardiovascular: Negative for chest pain and palpitations.  Gastrointestinal: Negative for abdominal pain and blood in stool.  Genitourinary: Negative for dysuria, frequency and hematuria.  Musculoskeletal: Positive for arthralgias. Negative for back pain and neck pain.       Hand injury  Skin: Negative.   Neurological: Negative for dizziness, seizures and speech difficulty.  Psychiatric/Behavioral: Negative for confusion and hallucinations.     Physical Exam Updated Vital Signs BP (!) 129/94 (BP Location: Right Arm)   Pulse 67   Temp 98.2 F (36.8 C) (Oral)   Ht 5\' 11"  (1.803 m)   Wt 68 kg   SpO2 98%  BMI 20.92 kg/m   Physical Exam Vitals signs and nursing note reviewed.  Constitutional:      Appearance: He is well-developed. He is not toxic-appearing.  HENT:     Head: Normocephalic.     Right Ear: Tympanic membrane and external ear normal.     Left Ear: Tympanic membrane and external ear normal.  Eyes:     General: Lids are normal.     Pupils: Pupils are equal, round, and reactive to light.  Neck:     Musculoskeletal: Normal range of motion and neck supple.     Vascular: No carotid bruit.  Cardiovascular:     Rate and Rhythm: Normal rate and regular rhythm.     Pulses: Normal pulses.     Heart sounds: Normal heart sounds.  Pulmonary:     Effort: No respiratory  distress.     Breath sounds: Normal breath sounds.  Abdominal:     General: Bowel sounds are normal.     Palpations: Abdomen is soft.     Tenderness: There is no abdominal tenderness. There is no guarding.  Musculoskeletal: Normal range of motion.     Comments: There is full range of motion of the right shoulder and elbow.  There is no deformity of the right forearm.  There is soreness with range of motion of the right wrist.  There is pain to palpation of the carpal bone area of the right thumb.  There is no deformity palpated.  There is tenderness to palpation and attempted range of motion of the fingers.  There is a flexion deformity of the DIP of the ring finger.  The radial pulses 2+.  Capillary refill is less than 2 seconds.  Lymphadenopathy:     Head:     Right side of head: No submandibular adenopathy.     Left side of head: No submandibular adenopathy.     Cervical: No cervical adenopathy.  Skin:    General: Skin is warm and dry.  Neurological:     Mental Status: He is alert and oriented to person, place, and time.     Cranial Nerves: No cranial nerve deficit.     Sensory: No sensory deficit.  Psychiatric:        Speech: Speech normal.      ED Treatments / Results  Labs (all labs ordered are listed, but only abnormal results are displayed) Labs Reviewed - No data to display  EKG None  Radiology Dg Hand Complete Right  Result Date: 07/20/2018 CLINICAL DATA:  Injured hand while working on a car yesterday. EXAM: RIGHT HAND - COMPLETE 3+ VIEW COMPARISON:  None. FINDINGS: The joint spaces are maintained. No acute fracture is identified. Mild flexion deformity at the DIP joint of the ring finger likely due to remote extensor tendon injury. IMPRESSION: No acute bony findings. Electronically Signed   By: Marijo Sanes M.D.   On: 07/20/2018 13:59    Procedures Procedures (including critical care time)  Medications Ordered in ED Medications - No data to display   Initial  Impression / Assessment and Plan / ED Course  I have reviewed the triage vital signs and the nursing notes.  Pertinent labs & imaging results that were available during my care of the patient were reviewed by me and considered in my medical decision making (see chart for details).          Final Clinical Impressions(s) / ED Diagnoses MDM  Patient injured the right hand when an alternator fell  on the right hand.  The patient is left-hand dominant.  Patient having pain at multiple areas of the right hand.  X-ray of the right hand shows evidence of a extensor tendon injury of the distal ring finger.  No fracture or dislocation appreciated. Patient fitted with finger splint for the extensor tendon injury.  Thumb spica splint given for the thumb and wrist injury.  Prescription for ibuprofen and Ultram and Phenergan given to the patient.  Patient is to follow-up with Dr. Romeo AppleHarrison concerning the tendon injury.   Final diagnoses:  Mallet deformity of right ring finger  Contusion of right hand, initial encounter    ED Discharge Orders         Ordered    traMADol (ULTRAM) 50 MG tablet  Every 6 hours PRN     07/20/18 1645    promethazine (PHENERGAN) 12.5 MG tablet  Every 6 hours PRN     07/20/18 1645           Ivery QualeBryant, Danniell Rotundo, PA-C 07/20/18 1650    Mancel BaleWentz, Elliott, MD 07/20/18 1930

## 2018-11-17 ENCOUNTER — Encounter (HOSPITAL_COMMUNITY): Payer: Self-pay

## 2018-11-17 ENCOUNTER — Other Ambulatory Visit: Payer: Self-pay

## 2018-11-17 ENCOUNTER — Emergency Department (HOSPITAL_COMMUNITY)
Admission: EM | Admit: 2018-11-17 | Discharge: 2018-11-17 | Disposition: A | Discharge status: Home / Self Care | Payer: Medicaid Other | Attending: Emergency Medicine | Admitting: Emergency Medicine

## 2018-11-17 DIAGNOSIS — K0889 Other specified disorders of teeth and supporting structures: Secondary | ICD-10-CM | POA: Insufficient documentation

## 2018-11-17 DIAGNOSIS — K029 Dental caries, unspecified: Secondary | ICD-10-CM | POA: Insufficient documentation

## 2018-11-17 DIAGNOSIS — F17210 Nicotine dependence, cigarettes, uncomplicated: Secondary | ICD-10-CM | POA: Diagnosis not present

## 2018-11-17 DIAGNOSIS — Z79899 Other long term (current) drug therapy: Secondary | ICD-10-CM | POA: Diagnosis not present

## 2018-11-17 MED ORDER — ACETAMINOPHEN 500 MG PO TABS: 500.0000 mg | ORAL_TABLET | Freq: Four times a day (QID) | ORAL | 0 refills | Status: AC | PRN

## 2018-11-17 MED ORDER — NAPROXEN 500 MG PO TABS: 500.0000 mg | ORAL_TABLET | Freq: Two times a day (BID) | ORAL | 0 refills | Status: DC

## 2018-11-17 MED ORDER — CLINDAMYCIN HCL 300 MG PO CAPS: 300.0000 mg | ORAL_CAPSULE | Freq: Three times a day (TID) | ORAL | 0 refills | Status: AC

## 2018-11-17 NOTE — ED Provider Notes (Signed)
[facility] Provider Note   CSN: [id number] Arrival date & time: 11/17/18  1258     History   Chief Complaint Chief Complaint  Patient presents with  . Dental Pain    HPI Ryan Maldonado is a 33 y.o. male presents today for evaluation of acute onset, persistent left mandibular dental pain since yesterday.  Reports constant aching pain, radiates up to the right side of the face and into the ear.  Denies fevers, difficulty swallowing, drooling, or voice change.  Has tried ibuprofen, Tylenol, warm and cold compresses without relief of symptoms.  He called his dentist today but they were unable to see him in the office so recommended presentation to the ED for further evaluation and management.     The history is provided by the patient.    History reviewed. No pertinent past medical history.  There are no active problems to display for this patient.   History reviewed. No pertinent surgical history.      Home Medications    Prior to Admission medications   Medication Sig Start Date End Date Taking? Authorizing Provider  acetaminophen (TYLENOL) 500 MG tablet Take 1 tablet (500 mg total) by mouth every 6 (six) hours as needed. 11/17/18   Tomisha Reppucci A, PA-C  clindamycin (CLEOCIN) 300 MG capsule Take 1 capsule (300 mg total) by mouth 3 (three) times daily for 7 days. 11/17/18 11/24/18  Rodell Perna A, PA-C  cyclobenzaprine (FLEXERIL) 10 MG tablet Take 1 tablet (10 mg total) by mouth 3 (three) times daily as needed. 09/06/15   Triplett, Tammy, PA-C  HYDROcodone-acetaminophen (NORCO/VICODIN) 5-325 MG tablet Take 1 tablet by mouth every 4 (four) hours as needed. 04/17/16   Lily Kocher, PA-C  ibuprofen (ADVIL,MOTRIN) 800 MG tablet Take 1 tablet (800 mg total) by mouth 3 (three) times daily. 09/06/15   Triplett, Tammy, PA-C  meloxicam (MOBIC) 15 MG tablet Take 1 tablet (15 mg total) by mouth daily. 04/17/16   Lily Kocher, PA-C  naproxen (NAPROSYN) 500 MG tablet  Take 1 tablet (500 mg total) by mouth 2 (two) times daily with a meal. 11/17/18   Roger Kettles A, PA-C  promethazine (PHENERGAN) 12.5 MG tablet Take 1 tablet (12.5 mg total) by mouth every 6 (six) hours as needed. 07/20/18   Lily Kocher, PA-C  traMADol (ULTRAM) 50 MG tablet Take 1 tablet (50 mg total) by mouth every 6 (six) hours as needed. 07/20/18   Lily Kocher, PA-C    Family History Family History  Problem Relation Age of Onset  . Cancer Father     Social History Social History   Tobacco Use  . Smoking status: Current Every Day Smoker    Packs/day: 1.00    Years: 15.00    Pack years: 15.00    Types: Cigarettes  . Smokeless tobacco: Never Used  Substance Use Topics  . Alcohol use: No  . Drug use: No     Allergies   Tramadol and Penicillins   Review of Systems Review of Systems  Constitutional: Negative for chills and fever.  HENT: Positive for dental problem. Negative for drooling, facial swelling and trouble swallowing.   Musculoskeletal: Negative for neck stiffness.     Physical Exam Updated Vital Signs BP (!) 146/106 (BP Location: Right Arm)   Pulse 94   Temp 98.2 F (36.8 C) (Oral)   Resp 18   Ht 5\' 11"  (1.803 m)   Wt 65.8 kg   SpO2 100%   BMI 20.22  kg/m   Physical Exam Vitals signs and nursing note reviewed.  Constitutional:      General: He is not in acute distress.    Appearance: He is well-developed.     Comments: Resting comfortably in bed  HENT:     Head: Normocephalic and atraumatic.     Right Ear: External ear normal.     Left Ear: External ear normal.     Mouth/Throat:     Mouth: Mucous membranes are moist.     Comments: Diffusely decaying dentition with cracked and missing teeth.  Some tenderness to percussion of the left second premolar which is cracked with exposed dentin.  No trismus.  Tolerating secretions without difficulty.  No drainable abscess noted.  No subglossal or submental swelling or tenderness. Eyes:     General:         Right eye: No discharge.        Left eye: No discharge.     Conjunctiva/sclera: Conjunctivae normal.  Neck:     Musculoskeletal: Normal range of motion and neck supple. No neck rigidity or muscular tenderness.     Vascular: No JVD.     Trachea: No tracheal deviation.  Cardiovascular:     Rate and Rhythm: Normal rate.  Pulmonary:     Effort: Pulmonary effort is normal.  Abdominal:     General: There is no distension.  Skin:    General: Skin is warm and dry.     Findings: No erythema.  Neurological:     Mental Status: He is alert.  Psychiatric:        Behavior: Behavior normal.      ED Treatments / Results  Labs (all labs ordered are listed, but only abnormal results are displayed) Labs Reviewed - No data to display  EKG None  Radiology No results found.  Procedures Procedures (including critical care time)  Medications Ordered in ED Medications - No data to display   Initial Impression / Assessment and Plan / ED Course  I have reviewed the triage vital signs and the nursing notes.  Pertinent labs & imaging results that were available during my care of the patient were reviewed by me and considered in my medical decision making (see chart for details).        Patient with toothache.  Grossly decaying dentition on examination, has been seen in the ED for dental pain previously. No gross abscess amenable to drainage.  Patient is afebrile, vital signs are stable. He is nontoxic in appearance.  Tolerating secretions without difficulty. Exam unconcerning for Ludwig's angina, peritonsillar abscess, strep pharyngitis, or spread of infection.  He has a Education officer, community with whom he can follow-up outpatient.  Will start on clindamycin due to penicillin allergy.  Discussed strict ED return precautions. Patient verbalized understanding of and agreement with plan and is safe for discharge home at this time.    Final Clinical Impressions(s) / ED Diagnoses   Final diagnoses:  Pain,  dental  Dental caries    ED Discharge Orders         Ordered    clindamycin (CLEOCIN) 300 MG capsule  3 times daily     11/17/18 1435    naproxen (NAPROSYN) 500 MG tablet  2 times daily with meals     11/17/18 1435    acetaminophen (TYLENOL) 500 MG tablet  Every 6 hours PRN     11/17/18 1435           Priest Lockridge, [place] A, PA-C 11/17/18 1438  Eber HongMiller, Brian, MD 11/18/18 [phone]

## 2018-11-17 NOTE — ED Triage Notes (Signed)
Pt presents to ED with complaints of tooth ache on bottom left since last night. Pt states he called dentist but was unable to get in.

## 2018-11-17 NOTE — Discharge Instructions (Signed)
Please take all of your antibiotics until finished!   You may develop abdominal discomfort or diarrhea from the antibiotic.  You may help offset this with probiotics which you can buy or get in yogurt. Do not eat  or take the probiotics until 2 hours after your antibiotic.   Apply warm or cool compresses to jaw throughout the day, whichever feels best. Take naproxen twice daily with meals for pain. Do not take ibuprofen, advil, motrin, or aleve in addition to this, but you can take 1 to 2 tablets of Tylenol (350mg -1000mg  depending on the dose) every 6 hours as needed for pain as well.  Do not exceed 4000 mg of Tylenol daily.  Take this with food to avoid upset stomach issues.  You may also use warm water salt gargles, Orajel, or other over-the-counter dental pain remedies.  Use lidocaine swish and spit as needed for pain.do not swallow the solution.    Followup with a dentist is very important for ongoing evaluation and management of recurrent dental pain.  I have given you the information for our dentist on-call but you can also follow-up with your dentist.  Return to emergency department for emergent changing or worsening symptoms such as fever, worsening facial swelling, difficulty breathing or swallowing, throat tightness, or vision changes.

## 2018-11-18 ENCOUNTER — Encounter (HOSPITAL_COMMUNITY): Payer: Self-pay

## 2018-11-18 ENCOUNTER — Emergency Department (HOSPITAL_COMMUNITY)
Admission: EM | Admit: 2018-11-18 | Discharge: 2018-11-18 | Disposition: A | Discharge status: Home / Self Care | Payer: Medicaid Other | Attending: Emergency Medicine | Admitting: Emergency Medicine

## 2018-11-18 DIAGNOSIS — R220 Localized swelling, mass and lump, head: Secondary | ICD-10-CM | POA: Diagnosis present

## 2018-11-18 DIAGNOSIS — K047 Periapical abscess without sinus: Secondary | ICD-10-CM | POA: Insufficient documentation

## 2018-11-18 DIAGNOSIS — F17210 Nicotine dependence, cigarettes, uncomplicated: Secondary | ICD-10-CM | POA: Insufficient documentation

## 2018-11-18 MED ORDER — CEFTRIAXONE SODIUM 1 G IJ SOLR
1.0000 g | Freq: Once | INTRAMUSCULAR | Status: AC
  Administered 2018-11-18: 1 g via INTRAMUSCULAR
  Filled 2018-11-18: qty 10

## 2018-11-18 MED ORDER — HYDROCODONE-ACETAMINOPHEN 5-325 MG PO TABS: 1.0000 | ORAL_TABLET | ORAL | 0 refills | Status: DC | PRN

## 2018-11-18 MED ORDER — CLINDAMYCIN PHOSPHATE 900 MG/6ML IJ SOLN: 600.0000 mg | Freq: Once | INTRAMUSCULAR | Status: DC

## 2018-11-18 NOTE — ED Provider Notes (Signed)
[facility] EMERGENCY DEPARTMENT Provider Note   CSN: [id number] Arrival date & time: 11/18/18  0421     History   Chief Complaint Chief Complaint  Patient presents with  . Facial Swelling    HPI Ryan Maldonado is a 33 y.o. male.     Patient presents to the emergency department for evaluation of facial swelling.  Patient was seen in the ER yesterday for dental pain, started on clindamycin.  Patient reports that he woke up tonight with swelling of the left lower portion of his jaw and increased pain.  Naprosyn has not helped his pain.     History reviewed. No pertinent past medical history.  There are no active problems to display for this patient.   History reviewed. No pertinent surgical history.      Home Medications    Prior to Admission medications   Medication Sig Start Date End Date Taking? Authorizing Provider  clindamycin (CLEOCIN) 300 MG capsule Take 1 capsule (300 mg total) by mouth 3 (three) times daily for 7 days. 11/17/18 11/24/18 Yes Fawze, Mina A, PA-C  naproxen (NAPROSYN) 500 MG tablet Take 1 tablet (500 mg total) by mouth 2 (two) times daily with a meal. 11/17/18  Yes Fawze, Mina A, PA-C  acetaminophen (TYLENOL) 500 MG tablet Take 1 tablet (500 mg total) by mouth every 6 (six) hours as needed. 11/17/18   Fawze, Mina A, PA-C  cyclobenzaprine (FLEXERIL) 10 MG tablet Take 1 tablet (10 mg total) by mouth 3 (three) times daily as needed. 09/06/15   Triplett, Tammy, PA-C  HYDROcodone-acetaminophen (NORCO/VICODIN) 5-325 MG tablet Take 1-2 tablets by mouth every 4 (four) hours as needed. 11/18/18   Orpah Greek, MD  ibuprofen (ADVIL,MOTRIN) 800 MG tablet Take 1 tablet (800 mg total) by mouth 3 (three) times daily. 09/06/15   Triplett, Tammy, PA-C  meloxicam (MOBIC) 15 MG tablet Take 1 tablet (15 mg total) by mouth daily. 04/17/16   Lily Kocher, PA-C  promethazine (PHENERGAN) 12.5 MG tablet Take 1 tablet (12.5 mg total) by mouth every 6 (six) hours as  needed. 07/20/18   Lily Kocher, PA-C  traMADol (ULTRAM) 50 MG tablet Take 1 tablet (50 mg total) by mouth every 6 (six) hours as needed. 07/20/18   Lily Kocher, PA-C    Family History Family History  Problem Relation Age of Onset  . Cancer Father     Social History Social History   Tobacco Use  . Smoking status: Current Every Day Smoker    Packs/day: 1.00    Years: 15.00    Pack years: 15.00    Types: Cigarettes  . Smokeless tobacco: Never Used  Substance Use Topics  . Alcohol use: No  . Drug use: No     Allergies   Tramadol and Penicillins   Review of Systems Review of Systems  Constitutional: Negative for fever.  HENT: Positive for dental problem and facial swelling.   All other systems reviewed and are negative.    Physical Exam Updated Vital Signs BP (!) 154/104 (BP Location: Left Arm)   Pulse (!) 125   Temp 98.4 F (36.9 C) (Oral)   Resp (!) 21   Ht 5\' 11"  (1.803 m)   Wt 65.8 kg   SpO2 100%   BMI 20.22 kg/m   Physical Exam Vitals signs and nursing note reviewed.  Constitutional:      General: He is not in acute distress.    Appearance: Normal appearance. He is well-developed.  HENT:  Head: Normocephalic and atraumatic.     Right Ear: Hearing normal.     Left Ear: Hearing normal.     Nose: Nose normal.     Mouth/Throat:     Dentition: Dental tenderness, gingival swelling and dental caries present.     Comments: Patient has left anterior jaw swelling and tenderness without a drainable fluid collection present Eyes:     Conjunctiva/sclera: Conjunctivae normal.     Pupils: Pupils are equal, round, and reactive to light.  Neck:     Musculoskeletal: Normal range of motion and neck supple.  Cardiovascular:     Rate and Rhythm: Regular rhythm.     Heart sounds: S1 normal and S2 normal. No murmur. No friction rub. No gallop.   Pulmonary:     Effort: Pulmonary effort is normal. No respiratory distress.     Breath sounds: Normal breath sounds.   Chest:     Chest wall: No tenderness.  Abdominal:     General: Bowel sounds are normal.     Palpations: Abdomen is soft.     Tenderness: There is no abdominal tenderness. There is no guarding or rebound. Negative signs include Murphy's sign and McBurney's sign.     Hernia: No hernia is present.  Musculoskeletal: Normal range of motion.  Skin:    General: Skin is warm and dry.     Findings: No rash.  Neurological:     Mental Status: He is alert and oriented to person, place, and time.     GCS: GCS eye subscore is 4. GCS verbal subscore is 5. GCS motor subscore is 6.     Cranial Nerves: No cranial nerve deficit.     Sensory: No sensory deficit.     Coordination: Coordination normal.  Psychiatric:        Speech: Speech normal.        Behavior: Behavior normal.        Thought Content: Thought content normal.      ED Treatments / Results  Labs (all labs ordered are listed, but only abnormal results are displayed) Labs Reviewed - No data to display  EKG None  Radiology No results found.  Procedures Procedures (including critical care time)  Medications Ordered in ED Medications  clindamycin (CLEOCIN) injection 600 mg (has no administration in time range)     Initial Impression / Assessment and Plan / ED Course  I have reviewed the triage vital signs and the nursing notes.  Pertinent labs & imaging results that were available during my care of the patient were reviewed by me and considered in my medical decision making (see chart for details).        Patient with developing abscess.  He was started on appropriate antibiotic therapy yesterday but has not had enough doses.  Will supplement this with IM injection of clindamycin and pain medication.  Final Clinical Impressions(s) / ED Diagnoses   Final diagnoses:  Dental abscess    ED Discharge Orders         Ordered    HYDROcodone-acetaminophen (NORCO/VICODIN) 5-325 MG tablet  Every 4 hours PRN     11/18/18  0533           Gilda Crease, MD 11/18/18 [phone]

## 2018-11-18 NOTE — ED Triage Notes (Signed)
Pt states he was seen here yesterday and given antibiotics and pain meds which he started last night. Pt states he awoke this am with increased swelling to the left lower face and jaw.

## 2018-11-20 MED FILL — Hydrocodone-Acetaminophen Tab 5-325 MG: ORAL | Qty: 6 | Status: AC

## 2019-02-19 ENCOUNTER — Ambulatory Visit: Payer: Medicaid Other | Attending: Internal Medicine

## 2020-04-26 ENCOUNTER — Encounter (HOSPITAL_COMMUNITY): Payer: Self-pay | Admitting: Emergency Medicine

## 2020-04-26 ENCOUNTER — Emergency Department (HOSPITAL_COMMUNITY)
Admission: EM | Admit: 2020-04-26 | Discharge: 2020-04-26 | Disposition: A | Discharge status: Home / Self Care | Payer: Medicaid Other | Attending: Emergency Medicine | Admitting: Emergency Medicine

## 2020-04-26 ENCOUNTER — Other Ambulatory Visit: Payer: Self-pay

## 2020-04-26 DIAGNOSIS — X58XXXA Exposure to other specified factors, initial encounter: Secondary | ICD-10-CM | POA: Insufficient documentation

## 2020-04-26 DIAGNOSIS — L03012 Cellulitis of left finger: Secondary | ICD-10-CM | POA: Diagnosis not present

## 2020-04-26 DIAGNOSIS — S60947A Unspecified superficial injury of left little finger, initial encounter: Secondary | ICD-10-CM | POA: Diagnosis present

## 2020-04-26 DIAGNOSIS — F17210 Nicotine dependence, cigarettes, uncomplicated: Secondary | ICD-10-CM | POA: Insufficient documentation

## 2020-04-26 MED ORDER — CELECOXIB 200 MG PO CAPS: 200.0000 mg | ORAL_CAPSULE | Freq: Two times a day (BID) | ORAL | 0 refills | Status: DC

## 2020-04-26 MED ORDER — DOXYCYCLINE HYCLATE 100 MG PO CAPS: 100.0000 mg | ORAL_CAPSULE | Freq: Two times a day (BID) | ORAL | 0 refills | Status: DC

## 2020-04-26 NOTE — Discharge Instructions (Signed)
Get help right away if you have:  A fever or chills.  Redness spreading away from the affected area.  Joint or muscle pain.

## 2020-04-26 NOTE — ED Provider Notes (Signed)
[facility] EMERGENCY DEPARTMENT Provider Note   CSN: [id number] Arrival date & time: 04/26/20  1739     History Chief Complaint  Patient presents with  . Finger Injury    Ryan Maldonado is a 35 y.o. male.  Who presents emergency department with chief complaint of left little finger pain and swelling.  Onset yesterday night.  He has been soaking it without relief.  The pain is described as throbbing.  He has no known injuries.  No previous history of the same.  Pain is moderate.  HPI     History reviewed. No pertinent past medical history.  There are no problems to display for this patient.   History reviewed. No pertinent surgical history.     Family History  Problem Relation Age of Onset  . Cancer Father     Social History   Tobacco Use  . Smoking status: Current Every Day Smoker    Packs/day: 1.00    Years: 15.00    Pack years: 15.00    Types: Cigarettes  . Smokeless tobacco: Never Used  Vaping Use  . Vaping Use: Never used  Substance Use Topics  . Alcohol use: No  . Drug use: No    Home Medications Prior to Admission medications   Medication Sig Start Date End Date Taking? Authorizing Provider  celecoxib (CELEBREX) 200 MG capsule Take 1 capsule (200 mg total) by mouth 2 (two) times daily. 04/26/20  Yes Laiken Nohr, PA-C  doxycycline (VIBRAMYCIN) 100 MG capsule Take 1 capsule (100 mg total) by mouth 2 (two) times daily. One po bid x 7 days 04/26/20  Yes Romon Devereux, PA-C  acetaminophen (TYLENOL) 500 MG tablet Take 1 tablet (500 mg total) by mouth every 6 (six) hours as needed. 11/17/18   Fawze, Mina A, PA-C  cyclobenzaprine (FLEXERIL) 10 MG tablet Take 1 tablet (10 mg total) by mouth 3 (three) times daily as needed. 09/06/15   Triplett, Tammy, PA-C  HYDROcodone-acetaminophen (NORCO/VICODIN) 5-325 MG tablet Take 1-2 tablets by mouth every 4 (four) hours as needed. 11/18/18   Gilda Crease, MD  ibuprofen (ADVIL,MOTRIN) 800 MG tablet Take 1 tablet  (800 mg total) by mouth 3 (three) times daily. 09/06/15   Triplett, Tammy, PA-C  meloxicam (MOBIC) 15 MG tablet Take 1 tablet (15 mg total) by mouth daily. 04/17/16   Ivery Quale, PA-C  naproxen (NAPROSYN) 500 MG tablet Take 1 tablet (500 mg total) by mouth 2 (two) times daily with a meal. 11/17/18   Fawze, Mina A, PA-C  promethazine (PHENERGAN) 12.5 MG tablet Take 1 tablet (12.5 mg total) by mouth every 6 (six) hours as needed. 07/20/18   Ivery Quale, PA-C  traMADol (ULTRAM) 50 MG tablet Take 1 tablet (50 mg total) by mouth every 6 (six) hours as needed. 07/20/18   Ivery Quale, PA-C    Allergies    Tramadol and Penicillins  Review of Systems   Review of Systems Positive for finger redness and swelling, negative for fevers or chills Physical Exam Updated Vital Signs BP 113/80 (BP Location: Right Arm)   Pulse 66   Temp 98.1 F (36.7 C) (Oral)   Resp 18   Ht 5\' 11"  (1.803 m)   Wt 68 kg   SpO2 100%   BMI 20.92 kg/m   Physical Exam Vitals and nursing note reviewed.  Constitutional:      General: He is not in acute distress.    Appearance: He is well-developed. He is not diaphoretic.  HENT:  Head: Normocephalic and atraumatic.  Eyes:     General: No scleral icterus.    Conjunctiva/sclera: Conjunctivae normal.  Cardiovascular:     Rate and Rhythm: Normal rate and regular rhythm.     Heart sounds: Normal heart sounds.  Pulmonary:     Effort: Pulmonary effort is normal. No respiratory distress.     Breath sounds: Normal breath sounds.  Abdominal:     Palpations: Abdomen is soft.     Tenderness: There is no abdominal tenderness.  Musculoskeletal:     Cervical back: Normal range of motion and neck supple.  Skin:    General: Skin is warm and dry.     Comments: Swelling, tenderness along the radial nailbed of the left little finger.  No obvious fluctuance.  Neurological:     Mental Status: He is alert.  Psychiatric:        Behavior: Behavior normal.     ED Results /  Procedures / Treatments   Labs (all labs ordered are listed, but only abnormal results are displayed) Labs Reviewed - No data to display  EKG None  Radiology No results found.  Procedures Procedures   Medications Ordered in ED Medications - No data to display  ED Course  I have reviewed the triage vital signs and the nursing notes.  Pertinent labs & imaging results that were available during my care of the patient were reviewed by me and considered in my medical decision making (see chart for details).    MDM Rules/Calculators/A&P                          Patient here with paronychia.  His only been present for about 24 hours.  It does not appear to be ready for incision and drainage.  No evidence of cellulitis or felon.  Patient was placed on doxycycline, Celebrex for pain, work note given, return precautions discussed.  Appears appropriate for discharge at this time.  Patient is advised to soak his finger frequently. Final Clinical Impression(s) / ED Diagnoses Final diagnoses:  Paronychia of finger of left hand    Rx / DC Orders ED Discharge Orders         Ordered    doxycycline (VIBRAMYCIN) 100 MG capsule  2 times daily        04/26/20 1858    celecoxib (CELEBREX) 200 MG capsule  2 times daily        04/26/20 1858           Arthor Captain, PA-C 04/26/20 1901    Terald Sleeper, MD 04/26/20 2112

## 2020-04-26 NOTE — ED Triage Notes (Signed)
Pt c/o left fifth digit pain with redness and swelling at the nailbed x 1 day.

## 2020-05-03 ENCOUNTER — Emergency Department (HOSPITAL_COMMUNITY)
Admission: EM | Admit: 2020-05-03 | Discharge: 2020-05-03 | Disposition: A | Discharge status: Home / Self Care | Payer: Medicaid Other | Attending: Emergency Medicine | Admitting: Emergency Medicine

## 2020-05-03 ENCOUNTER — Other Ambulatory Visit: Payer: Self-pay

## 2020-05-03 ENCOUNTER — Encounter (HOSPITAL_COMMUNITY): Payer: Self-pay | Admitting: Emergency Medicine

## 2020-05-03 DIAGNOSIS — L03012 Cellulitis of left finger: Secondary | ICD-10-CM | POA: Insufficient documentation

## 2020-05-03 DIAGNOSIS — F17210 Nicotine dependence, cigarettes, uncomplicated: Secondary | ICD-10-CM | POA: Insufficient documentation

## 2020-05-03 DIAGNOSIS — M79645 Pain in left finger(s): Secondary | ICD-10-CM | POA: Diagnosis present

## 2020-05-03 MED ORDER — LIDOCAINE-EPINEPHRINE 1 %-1:100000 IJ SOLN
20.0000 mL | Freq: Once | INTRAMUSCULAR | Status: AC
  Administered 2020-05-03: 20 mL via INTRADERMAL
  Filled 2020-05-03: qty 20

## 2020-05-03 NOTE — ED Triage Notes (Signed)
Pt c/o swelling and pain to left pinky; seen for same 4/9; reports no improvement

## 2020-05-03 NOTE — ED Provider Notes (Signed)
[facility] EMERGENCY DEPARTMENT Provider Note   CSN: [id number] Arrival date & time: 05/03/20  1951     History Chief Complaint  Patient presents with  . Hand Pain    Left pinky      DELOY ARCHEY is a 34 y.o. male.  HPI 35 year old male presents to the ER with a paronychia on his left fifth digit.  Seen here in the ER on 4/9 with same complaint, was given doxycycline and celecoxib.  He states he has been taking the medications but the pain has continued.  He has noticed swelling to the left pinky.  Denies any numbness or tingling.  No fevers or chills.  He has been also soaking his finger in warm water.    History reviewed. No pertinent past medical history.  There are no problems to display for this patient.   History reviewed. No pertinent surgical history.     Family History  Problem Relation Age of Onset  . Cancer Father     Social History   Tobacco Use  . Smoking status: Current Every Day Smoker    Packs/day: 1.00    Years: 15.00    Pack years: 15.00    Types: Cigarettes  . Smokeless tobacco: Never Used  Vaping Use  . Vaping Use: Never used  Substance Use Topics  . Alcohol use: No  . Drug use: No    Home Medications Prior to Admission medications   Medication Sig Start Date End Date Taking? Authorizing Provider  acetaminophen (TYLENOL) 500 MG tablet Take 1 tablet (500 mg total) by mouth every 6 (six) hours as needed. 11/17/18  Yes Fawze, Mina A, PA-C  celecoxib (CELEBREX) 200 MG capsule Take 1 capsule (200 mg total) by mouth 2 (two) times daily. 04/26/20  Yes Harris, Abigail, PA-C  doxycycline (VIBRAMYCIN) 100 MG capsule Take 1 capsule (100 mg total) by mouth 2 (two) times daily. One po bid x 7 days 04/26/20  Yes Harris, Abigail, PA-C  ibuprofen (ADVIL,MOTRIN) 800 MG tablet Take 1 tablet (800 mg total) by mouth 3 (three) times daily. Patient taking differently: Take 400 mg by mouth 3 (three) times daily. 09/06/15  Yes Triplett, Tammy, PA-C   cyclobenzaprine (FLEXERIL) 10 MG tablet Take 1 tablet (10 mg total) by mouth 3 (three) times daily as needed. Patient not taking: No sig reported 09/06/15   Triplett, Tammy, PA-C  HYDROcodone-acetaminophen (NORCO/VICODIN) 5-325 MG tablet Take 1-2 tablets by mouth every 4 (four) hours as needed. Patient not taking: No sig reported 11/18/18   Gilda Crease, MD  meloxicam (MOBIC) 15 MG tablet Take 1 tablet (15 mg total) by mouth daily. Patient not taking: No sig reported 04/17/16   Ivery Quale, PA-C  naproxen (NAPROSYN) 500 MG tablet Take 1 tablet (500 mg total) by mouth 2 (two) times daily with a meal. Patient not taking: No sig reported 11/17/18   Michela Pitcher A, PA-C  promethazine (PHENERGAN) 12.5 MG tablet Take 1 tablet (12.5 mg total) by mouth every 6 (six) hours as needed. Patient not taking: No sig reported 07/20/18   Ivery Quale, PA-C  traMADol (ULTRAM) 50 MG tablet Take 1 tablet (50 mg total) by mouth every 6 (six) hours as needed. Patient not taking: No sig reported 07/20/18   Ivery Quale, PA-C    Allergies    Tramadol and Penicillins  Review of Systems   Review of Systems  Constitutional: Negative for fever.  Musculoskeletal: Positive for arthralgias.  Skin: Positive for color change and  wound.  Neurological: Negative for weakness and numbness.    Physical Exam Updated Vital Signs BP (!) 141/93 (BP Location: Right Arm)   Pulse 65   Temp 98.4 F (36.9 C) (Oral)   Resp 17   Ht 5\' 11"  (1.803 m)   Wt 68 kg   SpO2 100%   BMI 20.92 kg/m   Physical Exam Vitals and nursing note reviewed.  Constitutional:      Appearance: He is well-developed.  HENT:     Head: Normocephalic and atraumatic.  Eyes:     Conjunctiva/sclera: Conjunctivae normal.  Cardiovascular:     Rate and Rhythm: Normal rate and regular rhythm.     Heart sounds: No murmur heard.   Pulmonary:     Effort: Pulmonary effort is normal. No respiratory distress.     Breath sounds: Normal breath  sounds.  Abdominal:     Palpations: Abdomen is soft.     Tenderness: There is no abdominal tenderness.  Musculoskeletal:     Cervical back: Neck supple.     Comments: Left fifth digit with evidence of paronychia.  Full flexion and extension of the left fifth finger.  No surrounding erythema, warmth.  Skin:    General: Skin is warm and dry.  Neurological:     Mental Status: He is alert.     ED Results / Procedures / Treatments   Labs (all labs ordered are listed, but only abnormal results are displayed) Labs Reviewed - No data to display  EKG None  Radiology No results found.  Procedures . Incision and Drainage  Date/Time: 05/03/2020 8:53 PM Performed by: 05/05/2020, PA-C Authorized by: Mare Ferrari, PA-C   Consent:    Consent obtained:  Verbal   Consent given by:  Patient   Risks discussed:  Bleeding, incomplete drainage, pain and damage to other organs   Alternatives discussed:  No treatment Universal protocol:    Procedure explained and questions answered to patient or proxy's satisfaction: yes     Relevant documents present and verified: yes     Test results available : yes     Imaging studies available: yes     Required blood products, implants, devices, and special equipment available: yes     Site/side marked: yes     Immediately prior to procedure, a time out was called: yes     Patient identity confirmed:  Verbally with patient Location:    Type:  Abscess   Location:  Upper extremity   Upper extremity location:  Finger   Finger location:  L small finger Pre-procedure details:    Skin preparation:  Betadine Anesthesia:    Anesthesia method:  Local infiltration   Local anesthetic:  Lidocaine 1% WITH epi Procedure type:    Complexity:  Complex Procedure details:    Incision types:  Single straight   Incision depth:  Subcutaneous   Wound management:  Probed and deloculated, irrigated with saline and extensive cleaning   Drainage:  Purulent    Drainage amount:  Moderate   Packing materials:  None Post-procedure details:    Procedure completion:  Tolerated well, no immediate complications     Medications Ordered in ED Medications  lidocaine-EPINEPHrine (XYLOCAINE W/EPI) 1 %-1:100000 (with pres) injection 20 mL (20 mLs Intradermal Given 05/03/20 2029)    ED Course  I have reviewed the triage vital signs and the nursing notes.  Pertinent labs & imaging results that were available during my care of the patient were reviewed by me  and considered in my medical decision making (see chart for details).    MDM Rules/Calculators/A&P                         35 year old male here with left fifth digit paronychia.  No evidence of deep space infection, low suspicion for septic joint, flexor tenosynovitis.  Tolerated drainage of the abscess well.  Stressed continuation of the doxycycline that he was prescribed, as well as warm soaks and keeping the area clean and dry.  We discussed signs of infection and return precautions.  He voiced understanding and is agreeable.  Stable for discharge. Final Clinical Impression(s) / ED Diagnoses Final diagnoses:  Paronychia of finger, left    Rx / DC Orders ED Discharge Orders    None       Leone Brand 05/03/20 2116    Bethann Berkshire, MD 05/06/20 [phone]

## 2020-05-03 NOTE — Discharge Instructions (Signed)
Please make sure to continue your antibiotics till finished.  Keep the area clean and dry.  You may also soak it in warm water occasionally.  You may take ibuprofen for pain.  Return to the ER for any new or worsening symptoms.

## 2020-10-06 ENCOUNTER — Emergency Department (HOSPITAL_COMMUNITY)
Admission: EM | Admit: 2020-10-06 | Discharge: 2020-10-07 | Disposition: A | Discharge status: Home / Self Care | Payer: Medicaid Other | Attending: Emergency Medicine | Admitting: Emergency Medicine

## 2020-10-06 ENCOUNTER — Emergency Department (HOSPITAL_COMMUNITY): Payer: Medicaid Other

## 2020-10-06 ENCOUNTER — Emergency Department (HOSPITAL_COMMUNITY)
Admission: EM | Admit: 2020-10-06 | Discharge: 2020-10-06 | Discharge status: Against Medical Advice | Payer: Medicaid Other

## 2020-10-06 ENCOUNTER — Encounter (HOSPITAL_COMMUNITY): Payer: Self-pay

## 2020-10-06 ENCOUNTER — Other Ambulatory Visit: Payer: Self-pay

## 2020-10-06 DIAGNOSIS — F17210 Nicotine dependence, cigarettes, uncomplicated: Secondary | ICD-10-CM | POA: Diagnosis not present

## 2020-10-06 DIAGNOSIS — W19XXXA Unspecified fall, initial encounter: Secondary | ICD-10-CM

## 2020-10-06 DIAGNOSIS — M546 Pain in thoracic spine: Secondary | ICD-10-CM

## 2020-10-06 DIAGNOSIS — I2699 Other pulmonary embolism without acute cor pulmonale: Secondary | ICD-10-CM | POA: Diagnosis not present

## 2020-10-06 DIAGNOSIS — S30810A Abrasion of lower back and pelvis, initial encounter: Secondary | ICD-10-CM | POA: Insufficient documentation

## 2020-10-06 DIAGNOSIS — W01198A Fall on same level from slipping, tripping and stumbling with subsequent striking against other object, initial encounter: Secondary | ICD-10-CM | POA: Diagnosis not present

## 2020-10-06 DIAGNOSIS — S3992XA Unspecified injury of lower back, initial encounter: Secondary | ICD-10-CM | POA: Diagnosis present

## 2020-10-06 MED ORDER — KETOROLAC TROMETHAMINE 30 MG/ML IJ SOLN
30.0000 mg | Freq: Once | INTRAMUSCULAR | Status: AC
  Administered 2020-10-06: 30 mg via INTRAVENOUS
  Filled 2020-10-06: qty 1

## 2020-10-06 NOTE — ED Provider Notes (Signed)
[facility] EMERGENCY DEPARTMENT Provider Note   CSN: [id number] Arrival date & time: 10/06/20  2234     History Chief Complaint  Patient presents with   Back Pain    Ryan Maldonado is a 35 y.o. male.  Patient with central mid back pain since falling off of a lawnmower last week.  States he was riding a lawnmower when he hit a hole and he fell backwards landing on his mid back.  Has had ongoing back pain ever since.  Worse with movement and bending.  Taking BC powders without relief.  2 days ago he was coughing and saw some streaks of blood in his mucus.  None since.  Does have pain with deep breathing.  Did hit his head but did not lose consciousness.  No neck pain.  No focal weakness, numbness or tingling.  No bowel or bladder incontinence.  No fever but has not checked his temperature at home.  No history of IV drug abuse or cancer.  States he has chronic back issues but never in this location.  Denies any low back pain.  Denies any radiation of the pain down his legs. No chest pain or shortness of breath.  Does not visit a doctor regularly or take any regular medications.  The history is provided by the patient.  Back Pain Associated symptoms: no abdominal pain, no chest pain, no fever, no headaches and no weakness       History reviewed. No pertinent past medical history.  There are no problems to display for this patient.   History reviewed. No pertinent surgical history.     Family History  Problem Relation Age of Onset   Cancer Father     Social History   Tobacco Use   Smoking status: Every Day    Packs/day: 1.00    Years: 15.00    Pack years: 15.00    Types: Cigarettes   Smokeless tobacco: Never  Vaping Use   Vaping Use: Never used  Substance Use Topics   Alcohol use: No   Drug use: No    Home Medications Prior to Admission medications   Medication Sig Start Date End Date Taking? Authorizing Provider  acetaminophen (TYLENOL) 500 MG tablet Take 1  tablet (500 mg total) by mouth every 6 (six) hours as needed. 11/17/18   Fawze, Mina A, PA-C  celecoxib (CELEBREX) 200 MG capsule Take 1 capsule (200 mg total) by mouth 2 (two) times daily. 04/26/20   Arthor Captain, PA-C  cyclobenzaprine (FLEXERIL) 10 MG tablet Take 1 tablet (10 mg total) by mouth 3 (three) times daily as needed. Patient not taking: No sig reported 09/06/15   Triplett, Tammy, PA-C  doxycycline (VIBRAMYCIN) 100 MG capsule Take 1 capsule (100 mg total) by mouth 2 (two) times daily. One po bid x 7 days 04/26/20   Arthor Captain, PA-C  HYDROcodone-acetaminophen (NORCO/VICODIN) 5-325 MG tablet Take 1-2 tablets by mouth every 4 (four) hours as needed. Patient not taking: No sig reported 11/18/18   Gilda Crease, MD  ibuprofen (ADVIL,MOTRIN) 800 MG tablet Take 1 tablet (800 mg total) by mouth 3 (three) times daily. Patient taking differently: Take 400 mg by mouth 3 (three) times daily. 09/06/15   Triplett, Tammy, PA-C  meloxicam (MOBIC) 15 MG tablet Take 1 tablet (15 mg total) by mouth daily. Patient not taking: No sig reported 04/17/16   Ivery Quale, PA-C  naproxen (NAPROSYN) 500 MG tablet Take 1 tablet (500 mg total) by mouth 2 (two)  times daily with a meal. Patient not taking: No sig reported 11/17/18   Michela Pitcher A, PA-C  promethazine (PHENERGAN) 12.5 MG tablet Take 1 tablet (12.5 mg total) by mouth every 6 (six) hours as needed. Patient not taking: No sig reported 07/20/18   Ivery Quale, PA-C  traMADol (ULTRAM) 50 MG tablet Take 1 tablet (50 mg total) by mouth every 6 (six) hours as needed. Patient not taking: No sig reported 07/20/18   Ivery Quale, PA-C    Allergies    Tramadol and Penicillins  Review of Systems   Review of Systems  Constitutional:  Negative for activity change, appetite change and fever.  HENT:  Negative for congestion.   Respiratory:  Positive for cough. Negative for chest tightness and shortness of breath.   Cardiovascular:  Negative for chest  pain and leg swelling.  Gastrointestinal:  Negative for abdominal pain, nausea and vomiting.  Musculoskeletal:  Positive for back pain.  Skin:  Positive for wound. Negative for rash.  Neurological:  Negative for dizziness, weakness and headaches.   all other systems are negative except as noted in the HPI and PMH.   Physical Exam Updated Vital Signs BP (!) 157/100   Pulse 85   Temp 97.8 F (36.6 C)   Resp 20   Ht 5\' 11"  (1.803 m)   Wt 68 kg   SpO2 98%   BMI 20.92 kg/m   Physical Exam Vitals and nursing note reviewed.  Constitutional:      General: He is not in acute distress.    Appearance: He is well-developed.  HENT:     Head: Normocephalic and atraumatic.     Mouth/Throat:     Pharynx: No oropharyngeal exudate.  Eyes:     Conjunctiva/sclera: Conjunctivae normal.     Pupils: Pupils are equal, round, and reactive to light.  Neck:     Comments: No C spine tenderness Cardiovascular:     Rate and Rhythm: Normal rate and regular rhythm.     Heart sounds: Normal heart sounds. No murmur heard. Pulmonary:     Effort: Pulmonary effort is normal. No respiratory distress.     Breath sounds: Normal breath sounds.  Abdominal:     Palpations: Abdomen is soft.     Tenderness: There is no abdominal tenderness. There is no guarding or rebound.  Musculoskeletal:        General: Tenderness present. Normal range of motion.     Cervical back: Normal range of motion and neck supple.     Comments: Midline tenderness to lower thoracic spine with slight abrasion.  No step-off or deformity.  5/5 strength in bilateral lower extremities. Ankle plantar and dorsiflexion intact. Great toe extension intact bilaterally. +2 DP and PT pulses. +2 patellar reflexes bilaterally. Normal gait.   Skin:    General: Skin is warm.  Neurological:     Mental Status: He is alert and oriented to person, place, and time.     Cranial Nerves: No cranial nerve deficit.     Motor: No abnormal muscle tone.      Coordination: Coordination normal.     Comments:  5/5 strength throughout. CN 2-12 intact.Equal grip strength.   Psychiatric:        Behavior: Behavior normal.    ED Results / Procedures / Treatments   Labs (all labs ordered are listed, but only abnormal results are displayed) Labs Reviewed - No data to display  EKG None  Radiology DG Chest 2 View  Result Date:  10/06/2020 CLINICAL DATA:  Recent fall with left-sided chest pain, initial encounter EXAM: CHEST - 2 VIEW COMPARISON:  12/19/2011 FINDINGS: Cardiac shadow is within normal limits. Lungs are clear bilaterally. No acute rib abnormality is noted. No pneumothorax is seen. IMPRESSION: No active cardiopulmonary disease. Electronically Signed   By: Alcide Clever M.D.   On: 10/06/2020 23:29   CT Angio Chest PE W and/or Wo Contrast  Result Date: 10/07/2020 CLINICAL DATA:  Concern for pulmonary embolism. EXAM: CT ANGIOGRAPHY CHEST WITH CONTRAST TECHNIQUE: Multidetector CT imaging of the chest was performed using the standard protocol during bolus administration of intravenous contrast. Multiplanar CT image reconstructions and MIPs were obtained to evaluate the vascular anatomy. CONTRAST:  OMNIPAQUE IOHEXOL 350 MG/ML SOLN COMPARISON:  Chest radiograph dated 10/06/2020. FINDINGS: Cardiovascular: There is no cardiomegaly or pericardial effusion. There is retrograde flow of contrast from right atrium into the IVC. The thoracic aorta is unremarkable. No pulmonary artery embolus identified. Mediastinum/Nodes: There is no hilar or mediastinal adenopathy. The esophagus is grossly unremarkable. No mediastinal fluid collection. Lungs/Pleura: There is a 7 mm left lower lobe nodule. No focal consolidation, pleural effusion or pneumothorax. The central airways are patent. Upper Abdomen: No acute abnormality. Musculoskeletal: No chest wall abnormality. No acute or significant osseous findings. Review of the MIP images confirms the above findings.  IMPRESSION: 1. No acute intrathoracic pathology. No CT evidence of pulmonary artery embolus. 2. A 7 mm left lower lobe pulmonary nodule. Non-contrast chest CT at 6-12 months is recommended. If the nodule is stable at time of repeat CT, then future CT at 18-24 months (from today's scan) is considered optional for low-risk patients, but is recommended for high-risk patients. This recommendation follows the consensus statement: Guidelines for Management of Incidental Pulmonary Nodules Detected on CT Images: From the Fleischner Society 2017; Radiology 2017; 284:228-243. Electronically Signed   By: Elgie Collard M.D.   On: 10/07/2020 00:53   CT T-SPINE NO CHARGE  Result Date: 10/07/2020 CLINICAL DATA:  Recent fall. EXAM: CT THORACIC SPINE WITHOUT CONTRAST TECHNIQUE: Multidetector CT images of the thoracic were obtained using the standard protocol without intravenous contrast. COMPARISON:  Chest CT dated 10/07/2020. FINDINGS: Alignment: Normal. Vertebrae: No acute fracture. Paraspinal and other soft tissues: Negative. Disc levels: Mild degenerative changes.  No acute findings IMPRESSION: No acute fracture or subluxation of the thoracic spine. Electronically Signed   By: Elgie Collard M.D.   On: 10/07/2020 00:56    Procedures Procedures   Medications Ordered in ED Medications  ketorolac (TORADOL) 30 MG/ML injection 30 mg (has no administration in time range)    ED Course  I have reviewed the triage vital signs and the nursing notes.  Pertinent labs & imaging results that were available during my care of the patient were reviewed by me and considered in my medical decision making (see chart for details).    MDM Rules/Calculators/A&P                          Low back pain after fall 1 week ago.  Neurologically intact.  Low suspicion for cord compression or cauda equina.  Chest x-ray in triage is negative.  No obvious thoracic compression fracture.  CT scan obtained given persistent midline  pain.  CT angio chest also obtained given his report of hemoptysis and SOB.  Imaging is reassuring.  No evidence of traumatic injury or fracture.  Patient informed of lung nodule and need for follow-up.  No  pulmonary embolism or pneumonia.  Low suspicion for cord compression or cauda equina.  We will treat supportively for likely back contusion.  Also give antibiotics for bronchitis.  Discussed smoking cessation, PCP follow-up, return precautions given. Final Clinical Impression(s) / ED Diagnoses Final diagnoses:  Fall  Acute midline thoracic back pain    Rx / DC Orders ED Discharge Orders     None        Netanya Yazdani, Jeannett Senior, MD 10/07/20 [phone]

## 2020-10-06 NOTE — ED Triage Notes (Signed)
Pt states falling of off lawnmower last week, he did not seek medical attention at that time. Has had ongoing mid back pain since then that radiates to left side. Worsening with movement. Also states coughing up bright red blood and mucous x2 about two days, none since then.

## 2020-10-07 MED ORDER — NAPROXEN 500 MG PO TABS: 500.0000 mg | ORAL_TABLET | Freq: Two times a day (BID) | ORAL | 0 refills | Status: DC

## 2020-10-07 MED ORDER — IOHEXOL 350 MG/ML SOLN
100.0000 mL | Freq: Once | INTRAVENOUS | Status: AC | PRN
  Administered 2020-10-07: 100 mL via INTRAVENOUS

## 2020-10-07 MED ORDER — HYDROCODONE-ACETAMINOPHEN 5-325 MG PO TABS
1.0000 | ORAL_TABLET | Freq: Once | ORAL | Status: AC
  Administered 2020-10-07: 1 via ORAL
  Filled 2020-10-07: qty 1

## 2020-10-07 MED ORDER — DOXYCYCLINE HYCLATE 100 MG PO TABS
100.0000 mg | ORAL_TABLET | Freq: Once | ORAL | Status: AC
  Administered 2020-10-07: 100 mg via ORAL
  Filled 2020-10-07: qty 1

## 2020-10-07 MED ORDER — DOXYCYCLINE HYCLATE 100 MG PO CAPS: 100.0000 mg | ORAL_CAPSULE | Freq: Two times a day (BID) | ORAL | 0 refills | Status: DC

## 2020-10-07 NOTE — Discharge Instructions (Signed)
Your testing shows no evidence of fractures or spinal cord injury.  Take the antibiotic as prescribed for bronchitis and stop smoking.  Your CT scan did show a lung nodule which needs follow-up in 6 to 12 months to make sure it is not changing in size. Return to the ED if you develop worsening pain, weakness, numbness, tingling, bowel or bladder incontinence, fever, vomiting, any other concerns

## 2021-03-30 ENCOUNTER — Other Ambulatory Visit: Payer: Self-pay

## 2021-03-30 ENCOUNTER — Ambulatory Visit (INDEPENDENT_AMBULATORY_CARE_PROVIDER_SITE_OTHER): Payer: Medicaid Other

## 2021-03-30 ENCOUNTER — Encounter: Payer: Self-pay | Admitting: Emergency Medicine

## 2021-03-30 ENCOUNTER — Ambulatory Visit
Admission: EM | Admit: 2021-03-30 | Discharge: 2021-03-30 | Disposition: A | Discharge status: Home / Self Care | Payer: Medicaid Other | Attending: Urgent Care | Admitting: Urgent Care

## 2021-03-30 DIAGNOSIS — M25511 Pain in right shoulder: Secondary | ICD-10-CM

## 2021-03-30 DIAGNOSIS — M542 Cervicalgia: Secondary | ICD-10-CM | POA: Diagnosis not present

## 2021-03-30 DIAGNOSIS — M549 Dorsalgia, unspecified: Secondary | ICD-10-CM

## 2021-03-30 MED ORDER — TIZANIDINE HCL 4 MG PO TABS: 4.0000 mg | ORAL_TABLET | Freq: Every day | ORAL | 0 refills | Status: DC

## 2021-03-30 MED ORDER — NAPROXEN 500 MG PO TABS: 500.0000 mg | ORAL_TABLET | Freq: Two times a day (BID) | ORAL | 0 refills | Status: DC
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
## 2021-12-07 ENCOUNTER — Encounter (HOSPITAL_COMMUNITY): Payer: Self-pay | Admitting: *Deleted

## 2021-12-07 ENCOUNTER — Emergency Department (HOSPITAL_COMMUNITY)
Admission: EM | Admit: 2021-12-07 | Discharge: 2021-12-08 | Disposition: A | Discharge status: Home / Self Care | Payer: Medicaid Other | Attending: Emergency Medicine | Admitting: Emergency Medicine

## 2021-12-07 ENCOUNTER — Other Ambulatory Visit: Payer: Self-pay

## 2021-12-07 DIAGNOSIS — S161XXA Strain of muscle, fascia and tendon at neck level, initial encounter: Secondary | ICD-10-CM | POA: Insufficient documentation

## 2021-12-07 DIAGNOSIS — W228XXA Striking against or struck by other objects, initial encounter: Secondary | ICD-10-CM | POA: Insufficient documentation

## 2021-12-07 MED ORDER — KETOROLAC TROMETHAMINE 30 MG/ML IJ SOLN
30.0000 mg | Freq: Once | INTRAMUSCULAR | Status: DC
  Filled 2021-12-07: qty 1

## 2021-12-07 MED ORDER — METHOCARBAMOL 500 MG PO TABS: 500.0000 mg | ORAL_TABLET | Freq: Two times a day (BID) | ORAL | 0 refills | Status: AC

## 2021-12-07 MED ORDER — NAPROXEN 500 MG PO TABS: 500.0000 mg | ORAL_TABLET | Freq: Two times a day (BID) | ORAL | 0 refills | Status: AC

## 2021-12-07 MED ORDER — METHOCARBAMOL 500 MG PO TABS
500.0000 mg | ORAL_TABLET | Freq: Once | ORAL | Status: DC
  Filled 2021-12-07: qty 1
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
## 2021-12-08 NOTE — ED Notes (Signed)
Received a phone call from pt's significant other. Due to significant other not being in pt's contact list, unable to give any information. Sig other wanted to know if an xray could be done on pt. Discussed concern with edp. Went to pt's room and pt is not in room.

## 2022-12-04 IMAGING — DX DG SHOULDER 2+V*R*
3 series · 3 of 3 positions shown · non-contrast
Comparison: None.

CLINICAL DATA: Right shoulder pain after an injury 2 days ago,
scapular pain, initial encounter.

EXAM:
RIGHT SHOULDER - 2+ VIEW

[shoulder internal rotation ap]
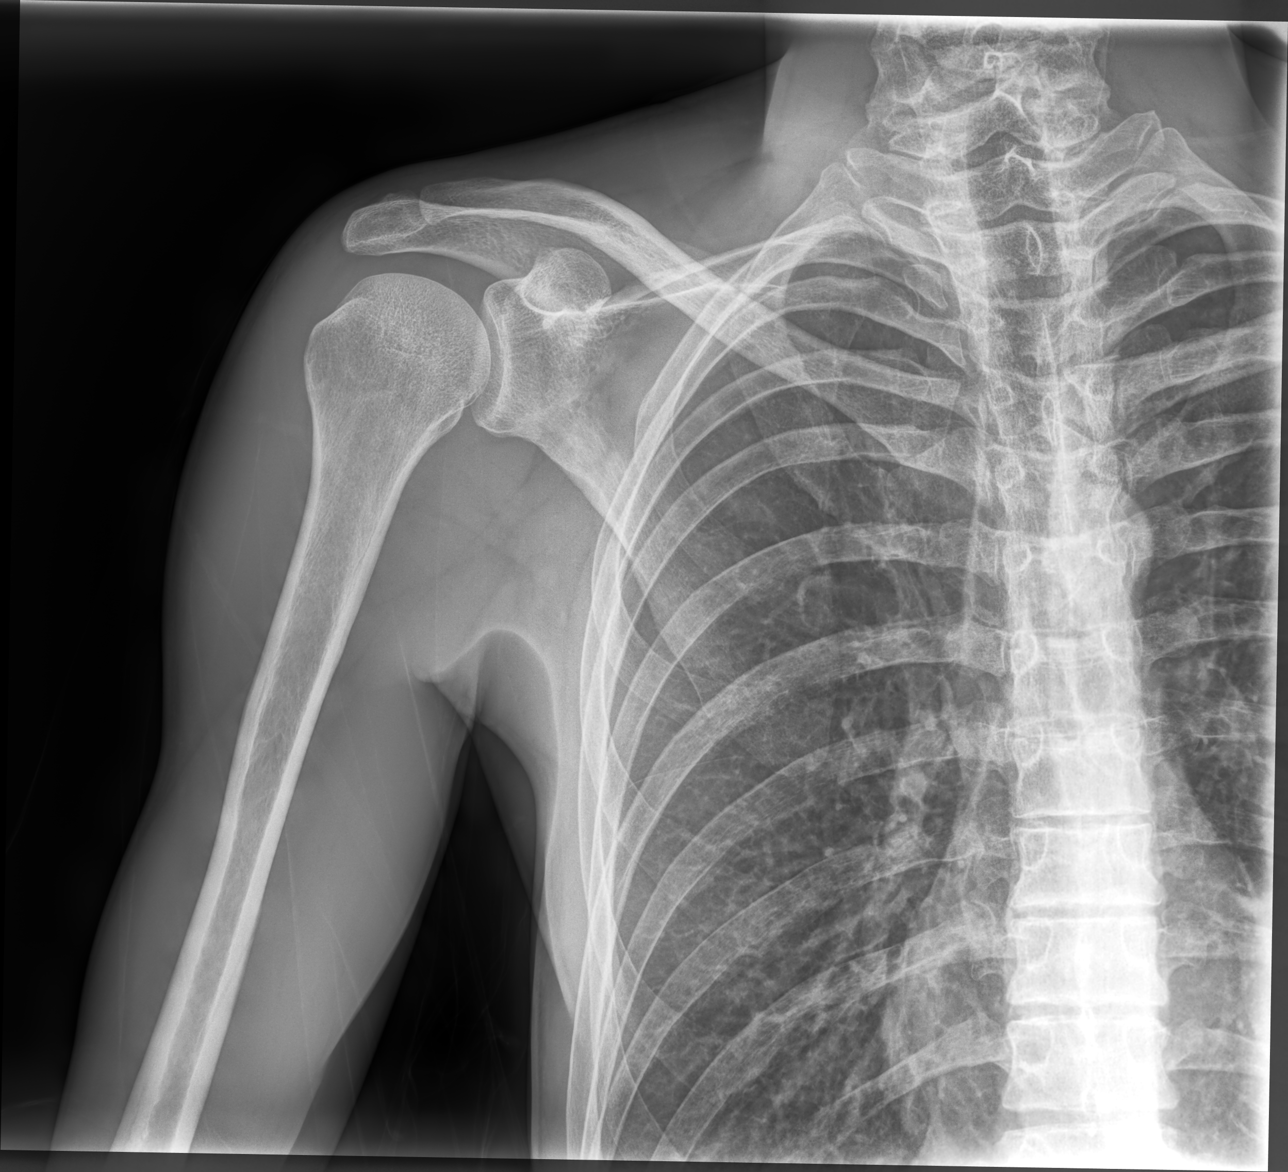

[shoulder external rotation ap]
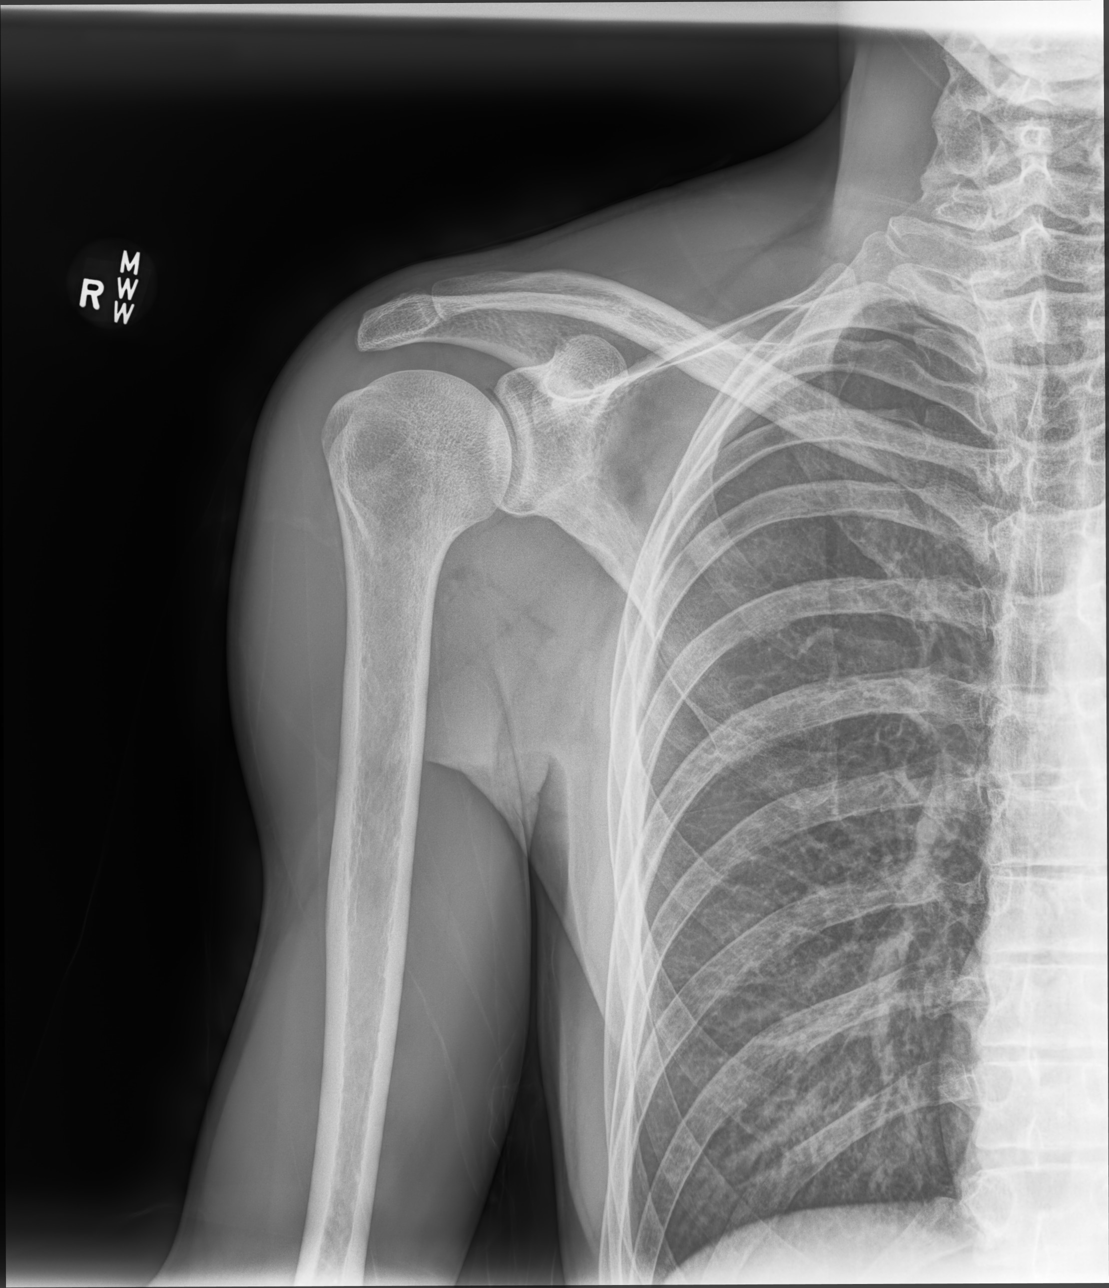

[shoulder (y view) lat]
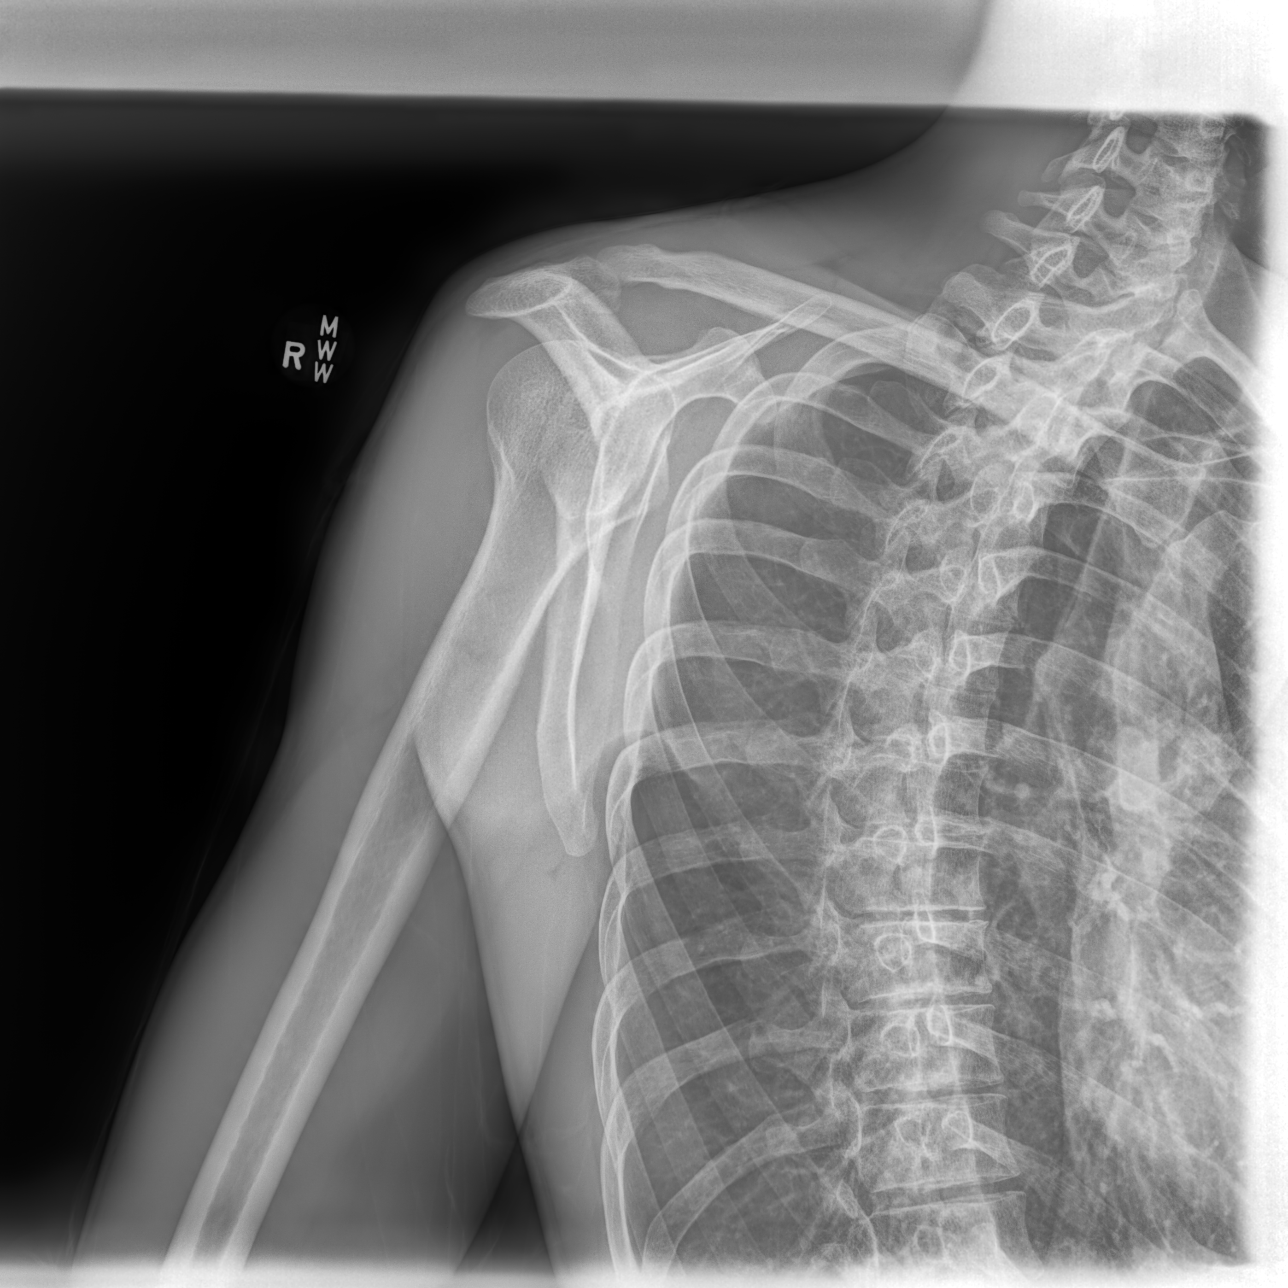

[3 of 3 positions shown; findings below may reference images not displayed]

FINDINGS: No acute osseous or joint abnormality. Visualized right chest is
unremarkable.
IMPRESSION: No acute findings.

## 2022-12-13 ENCOUNTER — Emergency Department (HOSPITAL_COMMUNITY)
Admission: EM | Admit: 2022-12-13 | Discharge: 2022-12-13 | Disposition: A | Discharge status: Home / Self Care | Payer: Self-pay | Attending: Emergency Medicine | Admitting: Emergency Medicine

## 2022-12-13 ENCOUNTER — Encounter (HOSPITAL_COMMUNITY): Payer: Self-pay | Admitting: Radiology

## 2022-12-13 ENCOUNTER — Other Ambulatory Visit: Payer: Self-pay

## 2022-12-13 DIAGNOSIS — Z1152 Encounter for screening for COVID-19: Secondary | ICD-10-CM | POA: Insufficient documentation

## 2022-12-13 DIAGNOSIS — J069 Acute upper respiratory infection, unspecified: Secondary | ICD-10-CM | POA: Insufficient documentation

## 2022-12-13 LAB — RESP PANEL BY RT-PCR (RSV, FLU A&B, COVID)  RVPGX2
Influenza A by PCR: NEGATIVE
Influenza B by PCR: NEGATIVE
Resp Syncytial Virus by PCR: NEGATIVE
SARS Coronavirus 2 by RT PCR: NEGATIVE

## 2022-12-13 NOTE — ED Triage Notes (Signed)
Pt endorses his daughter was sick with the same last week.

## 2022-12-13 NOTE — ED Triage Notes (Signed)
Pt states cough, runny nose and fever that started on Sunday and has progressively gotten worse. Pt states he doesn't know what his temp was he just felt hot.

## 2022-12-13 NOTE — ED Notes (Signed)
Noted no vitals listed in system Attached pt to partial monitor Pain 4/10

## 2022-12-13 NOTE — ED Notes (Signed)
Pt complains of cough that started last night Pt stated that sputum is white Stated he felt hot last night and complains of general body aches

## 2022-12-13 NOTE — Discharge Instructions (Signed)
Thank you for letting us evaluate you today.  Your respiratory panel today was negative for RSV, COVID, flu.  This may still be due to a viral upper respiratory infection that we do not test for as there are multiple viruses or that your viral count is not high enough to detect those viruses.  I have provided you with a work note today and you may take Tylenol every 8 hours as needed for pain or fever.  Please return to emergency department if you experience significant worsening of symptoms, shortness of breath, chest pain.

## 2022-12-13 NOTE — ED Provider Notes (Signed)
[place] EMERGENCY DEPARTMENT AT [facility] Provider Note   CSN: [id number] Arrival date & time: 12/13/22  1645     History  Chief Complaint  Patient presents with   Cough    Ryan Maldonado is a 37 y.o. male with no known medical history presents to emergency department for evaluation of productive cough, rhinorrhea, muscle soreness, subjective fever that started yesterday at 1200 that is progressively worsening.  He reports that his daughter was sick with similar symptoms last week but has since improved. Denies nausea vomiting, diarrhea, loss of taste, CP, SOB. No tylenol or ibuprofen at home.   Cough Associated symptoms: rhinorrhea   Associated symptoms: no chest pain, no chills, no fever, no headaches, no shortness of breath and no wheezing        Home Medications Prior to Admission medications   Medication Sig Start Date End Date Taking? Authorizing Provider  acetaminophen (TYLENOL) 500 MG tablet Take 1 tablet (500 mg total) by mouth every 6 (six) hours as needed. 11/17/18   Fawze, Mina A, PA-C  methocarbamol (ROBAXIN) 500 MG tablet Take 1 tablet (500 mg total) by mouth 2 (two) times daily. 12/07/21   Pollyann Savoy, MD  naproxen (NAPROSYN) 500 MG tablet Take 1 tablet (500 mg total) by mouth 2 (two) times daily with a meal. 12/07/21   Pollyann Savoy, MD      Allergies    Tramadol and Penicillins    Review of Systems   Review of Systems  Constitutional:  Negative for chills, fatigue and fever.  HENT:  Positive for rhinorrhea.   Respiratory:  Positive for cough. Negative for chest tightness, shortness of breath and wheezing.   Cardiovascular:  Negative for chest pain and palpitations.  Gastrointestinal:  Negative for abdominal pain, constipation, diarrhea, nausea and vomiting.  Neurological:  Negative for dizziness, seizures, weakness, light-headedness, numbness and headaches.    Physical Exam Updated Vital Signs Ht 5\' 11"  (1.803 m)   Wt 65.8  kg   BMI 20.22 kg/m  Physical Exam Vitals and nursing note reviewed.  Constitutional:      General: He is not in acute distress.    Appearance: Normal appearance. He is not ill-appearing.  HENT:     Head: Normocephalic and atraumatic.     Right Ear: Tympanic membrane, ear canal and external ear normal.     Left Ear: Tympanic membrane, ear canal and external ear normal.     Nose: Congestion present.     Mouth/Throat:     Mouth: Mucous membranes are moist.     Pharynx: Uvula midline. No oropharyngeal exudate, posterior oropharyngeal erythema or uvula swelling.     Tonsils: 0 on the right. 0 on the left.  Eyes:     Conjunctiva/sclera: Conjunctivae normal.  Cardiovascular:     Rate and Rhythm: Normal rate.  Pulmonary:     Effort: Pulmonary effort is normal. No respiratory distress.     Breath sounds: Normal breath sounds. No stridor. No wheezing, rhonchi or rales.     Comments: Speaking in full and complete sentences without difficulty Chest:     Chest wall: No tenderness.  Abdominal:     General: Bowel sounds are normal. There is no distension.     Palpations: Abdomen is soft.     Tenderness: There is no abdominal tenderness. There is no guarding or rebound.  Musculoskeletal:     Cervical back: Normal range of motion and neck supple. No rigidity.  Skin:  Capillary Refill: Capillary refill takes less than 2 seconds.     Coloration: Skin is not jaundiced or pale.  Neurological:     Mental Status: He is alert and oriented to person, place, and time. Mental status is at baseline.    ED Results / Procedures / Treatments   Labs (all labs ordered are listed, but only abnormal results are displayed) Labs Reviewed  RESP PANEL BY RT-PCR (RSV, FLU A&B, COVID)  [id number]    EKG None  Radiology No results found.  Procedures Procedures    Medications Ordered in ED Medications - No data to display  ED Course/ Medical Decision Making/ A&P                                  Medical Decision Making  Patient presents to the ED for concern of productive cough, rhinorrhea, muscle soreness, subjective fever that started yesterday at 1200, this involves an extensive number of treatment options, and is a complaint that carries with it a high risk of complications and morbidity.  The differential diagnosis includes viral infection, COVID, RSV, flu, pneumonia.  Is not exhaustive list   Co morbidities that complicate the patient evaluation  None   Additional history obtained:  Additional history obtained from Nursing and Outside Medical Records   External records from outside source obtained and reviewed including triage RN note   Lab Tests:  I Ordered, and personally interpreted labs.  The pertinent results include: Negative respiratory panel   Problem List / ED Course:  URI   Reevaluation:  After the interventions noted above, I reevaluated the patient and found that they have :improved   Social Determinants of Health:  Has PCP follow-up   Dispostion:  After consideration of the diagnostic results and the patients response to treatment, I feel that the patent would benefit from patient management of symptoms.  I discussed using Tylenol or ibuprofen at home as needed for pain and fever.  He can follow-up if symptoms do not improve within 7 days with his PCP.  Return precautions and discharge instructions provided.  Work note provided.  All questions answered to his satisfaction.       Final Clinical Impression(s) / ED Diagnoses Final diagnoses:  None    Rx / DC Orders ED Discharge Orders     None         Judithann Sheen, PA 12/13/22 2138    Lonell Grandchild, MD 12/14/22 [phone]

## 2023-07-16 ENCOUNTER — Emergency Department (HOSPITAL_COMMUNITY)
Admission: EM | Admit: 2023-07-16 | Discharge: 2023-07-16 | Payer: Self-pay | Attending: Emergency Medicine | Admitting: Emergency Medicine

## 2023-07-16 ENCOUNTER — Emergency Department (HOSPITAL_COMMUNITY): Payer: Self-pay

## 2023-07-16 DIAGNOSIS — T07XXXA Unspecified multiple injuries, initial encounter: Secondary | ICD-10-CM

## 2023-07-16 DIAGNOSIS — S0285XA Fracture of orbit, unspecified, initial encounter for closed fracture: Secondary | ICD-10-CM | POA: Insufficient documentation

## 2023-07-16 DIAGNOSIS — D72829 Elevated white blood cell count, unspecified: Secondary | ICD-10-CM | POA: Insufficient documentation

## 2023-07-16 DIAGNOSIS — S60221A Contusion of right hand, initial encounter: Secondary | ICD-10-CM | POA: Insufficient documentation

## 2023-07-16 DIAGNOSIS — S0990XA Unspecified injury of head, initial encounter: Secondary | ICD-10-CM | POA: Insufficient documentation

## 2023-07-16 DIAGNOSIS — S022XXA Fracture of nasal bones, initial encounter for closed fracture: Secondary | ICD-10-CM | POA: Insufficient documentation

## 2023-07-16 DIAGNOSIS — S60222A Contusion of left hand, initial encounter: Secondary | ICD-10-CM | POA: Insufficient documentation

## 2023-07-16 LAB — COMPREHENSIVE METABOLIC PANEL WITH GFR
ALT: 26 U/L (ref 0–44)
AST: 29 U/L (ref 15–41)
Albumin: 4.3 g/dL (ref 3.5–5.0)
Alkaline Phosphatase: 77 U/L (ref 38–126)
Anion gap: 13 (ref 5–15)
BUN: 6 mg/dL (ref 6–20)
CO2: 21 mmol/L — ABNORMAL LOW (ref 22–32)
Calcium: 9 mg/dL (ref 8.9–10.3)
Chloride: 107 mmol/L (ref 98–111)
Creatinine, Ser: 0.92 mg/dL (ref 0.61–1.24)
GFR, Estimated: >60 mL/min (ref >60)
Glucose, Bld: 113 mg/dL — ABNORMAL HIGH (ref 70–99)
Potassium: 3.5 mmol/L (ref 3.5–5.1)
Sodium: 141 mmol/L (ref 135–145)
Total Bilirubin: 0.7 mg/dL (ref 0.0–1.2)
Total Protein: 7.5 g/dL (ref 6.5–8.1)

## 2023-07-16 LAB — CBC WITH DIFFERENTIAL/PLATELET
Abs Immature Granulocytes: 0.04 10*3/uL (ref 0.00–0.07)
Basophils Absolute: 0.1 10*3/uL (ref 0.0–0.1)
Basophils Relative: 1 %
Eosinophils Absolute: 0.3 10*3/uL (ref 0.0–0.5)
Eosinophils Relative: 3 %
HCT: 41.8 % (ref 39.0–52.0)
Hemoglobin: 14.2 g/dL (ref 13.0–17.0)
Immature Granulocytes: 0 %
Lymphocytes Relative: 23 %
Lymphs Abs: 2.5 10*3/uL (ref 0.7–4.0)
MCH: 30.7 pg (ref 26.0–34.0)
MCHC: 34 g/dL (ref 30.0–36.0)
MCV: 90.5 fL (ref 80.0–100.0)
Monocytes Absolute: 0.6 10*3/uL (ref 0.1–1.0)
Monocytes Relative: 6 %
Neutro Abs: 7.5 10*3/uL (ref 1.7–7.7)
Neutrophils Relative %: 67 %
Platelets: 248 10*3/uL (ref 150–400)
RBC: 4.62 MIL/uL (ref 4.22–5.81)
RDW: 13.1 % (ref 11.5–15.5)
WBC: 11 10*3/uL — ABNORMAL HIGH (ref 4.0–10.5)
nRBC: 0 % (ref 0.0–0.2)

## 2023-07-16 LAB — ETHANOL: Alcohol, Ethyl (B): 247 mg/dL — ABNORMAL HIGH (ref <15)

## 2023-07-16 NOTE — ED Notes (Signed)
 DSS at pt bedside.
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
## 2023-07-16 NOTE — ED Notes (Signed)
 Attempted to finish patient's triage. Pt continues to avoid questions that this RN ask. Patient stated yall dont care about me. Reassured patient that we are asking these questions because we care and want to make sure we can provide care.
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
## 2023-07-16 NOTE — Discharge Instructions (Addendum)
 Ice to the affected areas.  Avoid blowing your nose.  Follow-up with ear nose and throat in a week or 2 when swelling is improved.  Return if any worsening or concerning symptoms

## 2023-10-05 ENCOUNTER — Other Ambulatory Visit: Payer: Self-pay

## 2023-10-05 ENCOUNTER — Encounter (HOSPITAL_COMMUNITY): Payer: Self-pay

## 2023-10-05 ENCOUNTER — Emergency Department (HOSPITAL_COMMUNITY)
Admission: EM | Admit: 2023-10-05 | Discharge: 2023-10-05 | Disposition: A | Discharge status: Home / Self Care | Payer: Self-pay

## 2023-10-05 DIAGNOSIS — J069 Acute upper respiratory infection, unspecified: Secondary | ICD-10-CM | POA: Insufficient documentation

## 2023-10-05 DIAGNOSIS — R059 Cough, unspecified: Secondary | ICD-10-CM | POA: Diagnosis present

## 2023-10-05 DIAGNOSIS — K047 Periapical abscess without sinus: Secondary | ICD-10-CM | POA: Insufficient documentation

## 2023-10-05 LAB — RESP PANEL BY RT-PCR (RSV, FLU A&B, COVID)  RVPGX2
Influenza A by PCR: NEGATIVE
Influenza B by PCR: NEGATIVE
Resp Syncytial Virus by PCR: NEGATIVE
SARS Coronavirus 2 by RT PCR: NEGATIVE

## 2023-10-05 MED ORDER — CLINDAMYCIN HCL 150 MG PO CAPS: 150.0000 mg | ORAL_CAPSULE | Freq: Three times a day (TID) | ORAL | 0 refills | Status: AC

## 2023-10-05 NOTE — ED Triage Notes (Signed)
 Reports daughter tested positive for covid yesterday and started feeling bad today with headache and sore throat and body aches.
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
## 2023-10-05 NOTE — Discharge Instructions (Addendum)
 Take your antibiotics as prescribed.  Call dentist office to make a follow-up appointment in 1 to 2 weeks.  You may need your tooth pulled in the future.  You have an upper respiratory infection.  The treatment for this is over-the-counter medications for cough and congestion as needed.  You can also alternate Tylenol  Motrin  as needed for pain or fever.
